# Patient Record
Sex: Female | Born: 1975 | Race: White | Hispanic: Yes | Smoking: Current some day smoker
Health system: Southern US, Community
[De-identification: ages and names within clinical notes are randomized; demographics above are authoritative.]

## PROBLEM LIST (undated history)

## (undated) DIAGNOSIS — J45909 Unspecified asthma, uncomplicated: Secondary | ICD-10-CM

## (undated) DIAGNOSIS — F319 Bipolar disorder, unspecified: Secondary | ICD-10-CM

## (undated) DIAGNOSIS — K219 Gastro-esophageal reflux disease without esophagitis: Secondary | ICD-10-CM

## (undated) DIAGNOSIS — F329 Major depressive disorder, single episode, unspecified: Secondary | ICD-10-CM

## (undated) DIAGNOSIS — M5126 Other intervertebral disc displacement, lumbar region: Secondary | ICD-10-CM

## (undated) DIAGNOSIS — F32A Depression, unspecified: Secondary | ICD-10-CM

## (undated) DIAGNOSIS — F419 Anxiety disorder, unspecified: Secondary | ICD-10-CM

## (undated) DIAGNOSIS — E119 Type 2 diabetes mellitus without complications: Secondary | ICD-10-CM

## (undated) DIAGNOSIS — F4481 Dissociative identity disorder: Secondary | ICD-10-CM

## (undated) HISTORY — PX: TUBAL LIGATION: SHX77

---

## 2007-06-05 ENCOUNTER — Emergency Department (HOSPITAL_COMMUNITY): Admission: EM | Admit: 2007-06-05 | Discharge: 2007-06-05 | Payer: Self-pay | Admitting: Family Medicine

## 2012-10-20 ENCOUNTER — Emergency Department (HOSPITAL_COMMUNITY): Payer: Medicaid Other

## 2012-10-20 ENCOUNTER — Emergency Department (HOSPITAL_COMMUNITY)
Admission: EM | Admit: 2012-10-20 | Discharge: 2012-10-20 | Disposition: A | Discharge status: Home / Self Care | Payer: Medicaid Other | Attending: Emergency Medicine | Admitting: Emergency Medicine

## 2012-10-20 DIAGNOSIS — Y9289 Other specified places as the place of occurrence of the external cause: Secondary | ICD-10-CM | POA: Insufficient documentation

## 2012-10-20 DIAGNOSIS — Y9301 Activity, walking, marching and hiking: Secondary | ICD-10-CM | POA: Insufficient documentation

## 2012-10-20 DIAGNOSIS — S93409A Sprain of unspecified ligament of unspecified ankle, initial encounter: Secondary | ICD-10-CM | POA: Insufficient documentation

## 2012-10-20 DIAGNOSIS — S93402A Sprain of unspecified ligament of left ankle, initial encounter: Secondary | ICD-10-CM

## 2012-10-20 DIAGNOSIS — W172XXA Fall into hole, initial encounter: Secondary | ICD-10-CM | POA: Insufficient documentation

## 2012-10-20 MED ORDER — IBUPROFEN 800 MG PO TABS: 800.0000 mg | ORAL_TABLET | Freq: Three times a day (TID) | ORAL | Status: DC

## 2012-10-20 NOTE — ED Notes (Signed)
Ortho tech called 

## 2012-10-20 NOTE — Progress Notes (Signed)
Orthopedic Tech Progress Note Patient Details:  Erika Henson 1975/08/10 [id number]  Ortho Devices Type of Ortho Device: CAM walker;Crutches Ortho Device/Splint Interventions: Application   Shawnie Pons 10/20/2012, 10:42 AM

## 2012-10-20 NOTE — ED Notes (Signed)
Ortho tech in. 

## 2012-10-20 NOTE — ED Notes (Signed)
Stepped in utility hole  Hurt left ankle no deformity minor abrasions to knee hurts to bear wt on ankle  ankle
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
## 2012-10-21 NOTE — ED Provider Notes (Signed)
Medical screening examination/treatment/procedure(s) were performed by non-physician practitioner and as supervising physician I was immediately available for consultation/collaboration.    Vida Roller, MD 10/21/12 2056

## 2013-01-08 ENCOUNTER — Encounter (HOSPITAL_COMMUNITY): Payer: Self-pay | Admitting: Emergency Medicine

## 2013-01-08 ENCOUNTER — Emergency Department (HOSPITAL_COMMUNITY)
Admission: EM | Admit: 2013-01-08 | Discharge: 2013-01-08 | Disposition: A | Discharge status: Home / Self Care | Payer: Worker's Compensation | Attending: Emergency Medicine | Admitting: Emergency Medicine

## 2013-01-08 DIAGNOSIS — X503XXA Overexertion from repetitive movements, initial encounter: Secondary | ICD-10-CM | POA: Insufficient documentation

## 2013-01-08 DIAGNOSIS — E119 Type 2 diabetes mellitus without complications: Secondary | ICD-10-CM | POA: Insufficient documentation

## 2013-01-08 DIAGNOSIS — S46909A Unspecified injury of unspecified muscle, fascia and tendon at shoulder and upper arm level, unspecified arm, initial encounter: Secondary | ICD-10-CM | POA: Insufficient documentation

## 2013-01-08 DIAGNOSIS — S4980XA Other specified injuries of shoulder and upper arm, unspecified arm, initial encounter: Secondary | ICD-10-CM | POA: Insufficient documentation

## 2013-01-08 DIAGNOSIS — Y990 Civilian activity done for income or pay: Secondary | ICD-10-CM | POA: Insufficient documentation

## 2013-01-08 DIAGNOSIS — M25512 Pain in left shoulder: Secondary | ICD-10-CM

## 2013-01-08 DIAGNOSIS — F17200 Nicotine dependence, unspecified, uncomplicated: Secondary | ICD-10-CM | POA: Insufficient documentation

## 2013-01-08 DIAGNOSIS — Y9289 Other specified places as the place of occurrence of the external cause: Secondary | ICD-10-CM | POA: Insufficient documentation

## 2013-01-08 DIAGNOSIS — Z79899 Other long term (current) drug therapy: Secondary | ICD-10-CM | POA: Insufficient documentation

## 2013-01-08 DIAGNOSIS — Y9389 Activity, other specified: Secondary | ICD-10-CM | POA: Insufficient documentation

## 2013-01-08 DIAGNOSIS — J45909 Unspecified asthma, uncomplicated: Secondary | ICD-10-CM | POA: Insufficient documentation

## 2013-01-08 HISTORY — DX: Unspecified asthma, uncomplicated: J45.909

## 2013-01-08 HISTORY — DX: Type 2 diabetes mellitus without complications: E11.9

## 2013-01-08 MED ORDER — CYCLOBENZAPRINE HCL 10 MG PO TABS: 10.0000 mg | ORAL_TABLET | Freq: Three times a day (TID) | ORAL | Status: DC | PRN

## 2013-01-08 MED ORDER — HYDROCODONE-ACETAMINOPHEN 5-325 MG PO TABS
1.0000 | ORAL_TABLET | Freq: Once | ORAL | Status: AC
  Administered 2013-01-08: 1 via ORAL
  Filled 2013-01-08: qty 1

## 2013-01-08 MED ORDER — HYDROCODONE-ACETAMINOPHEN 5-325 MG PO TABS: 1.0000 | ORAL_TABLET | ORAL | Status: DC | PRN

## 2013-01-08 NOTE — ED Notes (Addendum)
Was at work, was lifting a washing machine, feels like she pulled a muscle in L upper arm, pinpoints to deltoid bicep tricep area. Pain does not extend past shoulder or elbow. Occurred while lifting around 1615. Denies sx other than pain. Worse with movement. Better after taking an aleve PTA. H/o DM and asthma, not taking any meds for these.

## 2013-01-08 NOTE — ED Provider Notes (Signed)
CSN: [id number]     Arrival date & time 01/08/13  2044 History  This chart was scribed for non-physician practitioner Trixie Dredge, PA-C working with Gerhard Munch, MD by Valera Castle, ED scribe. This patient was seen in room TR05C/TR05C and the patient's care was started at 9:41 PM.   Chief Complaint  Patient presents with  . Arm Pain   The history is provided by the patient. No language interpreter was used.   HPI Comments: Erika Henson is a 37 y.o. female who presents to the Emergency Department complaining of sudden, sharp, constant, left arm pain, a severity of 6/10 PTA, onset 5 hours ago stating she pulled a muscle while lifting a washing machine with a co-worker while at work. She states the pain severity currently is an 8/10. 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 She states that when she arrived there she was told to clock off work. She reports taking Aleve for the pain, with some relief. She denies any known allergies. She denies left hand pain, numbness, tingling, back pain, abdominal pain, and any other associated symptoms. Pt has h/o DM and asthma.    PCP - No PCP Per Patient  Past Medical History  Diagnosis Date  . Diabetes mellitus without complication   . Asthma    Past Surgical History  Procedure Laterality Date  . Tubal ligation     No family history on file. History  Substance Use Topics  . Smoking status: Current Some Day Smoker  . Smokeless tobacco: Not on file  . Alcohol Use: Yes   OB History   Grav Para Term Preterm Abortions TAB SAB Ect Mult Living                 Review of Systems  Gastrointestinal: Negative for abdominal pain.  Musculoskeletal: Positive for myalgias (left arm). Negative for arthralgias and back pain.  Neurological: Negative for numbness (and tingling).   Allergies  Review of patient's allergies indicates no known allergies.  Home Medications   Current  Outpatient Rx  Name  Route  Sig  Dispense  Refill  . gabapentin (NEURONTIN) 100 MG capsule   Oral   Take 200 mg by mouth at bedtime.          LMP 11/08/2012  Physical Exam  Nursing note and vitals reviewed. Constitutional: She appears well-developed and well-nourished. No distress.  HENT:  Head: Normocephalic and atraumatic.  Neck: Neck supple.  Cardiovascular: Normal rate, regular rhythm and intact distal pulses.   Pulmonary/Chest: Effort normal and breath sounds normal. No respiratory distress. She has no wheezes. She has no rales.  Abdominal: Soft. She exhibits no distension. There is no tenderness. There is no rebound and no guarding.  Musculoskeletal:       Left shoulder: She exhibits decreased range of motion, tenderness and pain. She exhibits no bony tenderness, no swelling, no crepitus, no deformity and normal pulse.  Tenderness over left deltoid anterior shoulder. No bulging. Full active ROM of left elbow, left wrist, left hand.   Neurological: She is alert. She has normal strength. No cranial nerve deficit or sensory deficit.  Skin: Skin is warm. She is not diaphoretic.  No skin changes.   Psychiatric: She has a normal mood and affect. Her behavior is normal.    ED Course  Procedures (including critical care time)  COORDINATION OF CARE: 9:48 PM-Discussed treatment plan which includes pain medication and referral  to an orthopedist with pt at bedside and pt agreed to plan.   Labs Review Labs Reviewed - No data to display Imaging Review No results found.  EKG Interpretation   None       MDM   1. Left shoulder pain    Pt with soft tissue injury to the left shoulder while lifting a washing machine at work.  Neurovascularly intact.  No bony tenderness.  Decreased ROM of left shoulder secondary to pain, tenderness laterally and anteriorly.  Placed in sling, d/c home with norco, flexeril ,and orthopedic follow up.  Discussed findings, treatment, and follow up  with  patient.  Pt given return precautions.  Pt verbalizes understanding and agrees with plan.       I personally performed the services described in this documentation, which was scribed in my presence. The recorded information has been reviewed and is accurate.    Trixie Dredge, PA-C 01/08/13 2326

## 2013-01-09 NOTE — ED Provider Notes (Signed)
  Medical screening examination/treatment/procedure(s) were performed by non-physician practitioner and as supervising physician I was immediately available for consultation/collaboration.  EKG Interpretation   None          Gerhard Munch, MD 01/09/13 0028

## 2013-02-16 ENCOUNTER — Encounter: Payer: Self-pay | Admitting: Advanced Practice Midwife

## 2013-02-25 ENCOUNTER — Encounter: Payer: Self-pay | Admitting: Advanced Practice Midwife

## 2013-03-01 ENCOUNTER — Ambulatory Visit: Payer: Self-pay | Admitting: Advanced Practice Midwife

## 2013-03-15 ENCOUNTER — Ambulatory Visit: Payer: Self-pay | Admitting: Advanced Practice Midwife

## 2013-03-18 ENCOUNTER — Encounter: Payer: Self-pay | Admitting: Advanced Practice Midwife

## 2013-03-18 ENCOUNTER — Ambulatory Visit (INDEPENDENT_AMBULATORY_CARE_PROVIDER_SITE_OTHER): Payer: Medicaid Other | Admitting: Advanced Practice Midwife

## 2013-03-18 VITALS — BP 150/87 | HR 99 | Temp 98.2°F | Wt 239.0 lb

## 2013-03-18 DIAGNOSIS — Z0000 Encounter for general adult medical examination without abnormal findings: Secondary | ICD-10-CM

## 2013-03-18 LAB — POCT URINALYSIS DIPSTICK
BILIRUBIN UA: NEGATIVE
Blood, UA: NEGATIVE
Glucose, UA: 1000
KETONES UA: NEGATIVE
LEUKOCYTES UA: NEGATIVE
Nitrite, UA: NEGATIVE
PH UA: 5
PROTEIN UA: NEGATIVE
SPEC GRAV UA: 1.015
Urobilinogen, UA: NEGATIVE

## 2013-03-18 NOTE — Progress Notes (Signed)
Subjective:     Erika Henson is a 38 y.o. female here for a routine exam.  Current complaints: pt states that her menstral cycles that are painful and heavy.  Pt states that her cycles last 5-7 days and are irregular.  Pt states she  needs f/u on paps every 6 months.  Pt is now sexually active and needs annual exam.   Personal health questionnaire reviewed: yes.  Patient reports her last abnormal pap was in the late 90s. She reports needing cryosurgery. States since her tubal ligation after her son she has had painful periods and would like a hysterectomy. States her uterus is no good to her anymore she sees no need for it and would like it removed. She declines and is not interested in medication management for dysmenorrhea.   She sees a PCP who manages everything else for her. Declines need for labs other than STI screen of GC/CT.   Gynecologic History Patient's last menstrual period was 03/06/2013. Contraception: tubal ligation Last Pap: 2014. Results were: normal Last mammogram: n/a  Obstetric History OB History  Gravida Para Term Preterm AB SAB TAB Ectopic Multiple Living  3 3 2 1      2     # Outcome Date GA Lbr Len/2nd Weight Sex Delivery Anes PTL Lv  3 TRM 2002    M SVD   Y  2 PRE 2000 [id number]   M    ND  1 TRM 01/03/96    F SVD   Y       The following portions of the patient's history were reviewed and updated as appropriate: allergies, current medications, past family history, past medical history, past social history, past surgical history and problem list.  Review of Systems A comprehensive review of systems was negative.    Objective:    BP 150/87  Pulse 99  Temp(Src) 98.2 F (36.8 C)  Wt 239 lb (108.41 kg)  LMP 03/06/2013  General Appearance:    Alert, cooperative, no distress, appears stated age  Head:    Normocephalic, without obvious abnormality, atraumatic  Eyes:    PERRL, conjunctiva/corneas clear, EOM's intact, fundi    benign, both eyes  Ears:    Normal  TM's and external ear canals, both ears  Nose:   Nares normal, septum midline, mucosa normal, no drainage    or sinus tenderness  Throat:   Lips, mucosa, and tongue normal; teeth and gums normal  Neck:   Supple, symmetrical, trachea midline, no adenopathy;    thyroid:  no enlargement/tenderness/nodules; no carotid   bruit or JVD  Back:     Symmetric, no curvature, ROM normal, no CVA tenderness  Lungs:     Clear to auscultation bilaterally, respirations unlabored  Chest Wall:    No tenderness or deformity   Heart:    Regular rate and rhythm, S1 and S2 normal, no murmur, rub   or gallop  Breast Exam:    No tenderness, masses, or nipple abnormality  Abdomen:     Soft, non-tender, bowel sounds active all four quadrants,    no masses, no organomegaly  Genitalia:    Normal female without lesion, discharge or tenderness  Rectal:    Normal tone, normal prostate, no masses or tenderness;   guaiac negative stool  Extremities:   Extremities normal, atraumatic, no cyanosis or edema  Pulses:   2+ and symmetric all extremities  Skin:   Skin color, texture, turgor normal, multiple nevi on back various shapes and  sizes, not raised   Lymph nodes:   Cervical, supraclavicular, and axillary nodes normal  Neurologic:   CNII-XII intact, normal strength, sensation and reflexes    throughout      Assessment:    Healthy female exam.  Elevated BMI Dysmenorrhea BCM: Tubal Ligation Nevi on back   Plan:    Follow up in: month , with MD to discuss option of surgical intervention for dysmenorrhea..   Pap, GC/CT today Patient under care of PCP. Encouraged Dermatology for evaluation of Nevi on skin, patient declined referral.   30 min spent with patient greater than 80% spent in counseling and coordination of care.   Amy Roni Bread CNM

## 2013-03-19 LAB — GC/CHLAMYDIA PROBE AMP
CT PROBE, AMP APTIMA: POSITIVE — AB
GC Probe RNA: NEGATIVE

## 2013-03-21 LAB — PAP IG, CT-NG, RFX HPV ASCU
CHLAMYDIA PROBE AMP: POSITIVE — AB
GC Probe Amp: NEGATIVE

## 2013-03-22 ENCOUNTER — Other Ambulatory Visit: Payer: Self-pay | Admitting: Advanced Practice Midwife

## 2013-03-22 DIAGNOSIS — A5901 Trichomonal vulvovaginitis: Secondary | ICD-10-CM

## 2013-03-22 DIAGNOSIS — A749 Chlamydial infection, unspecified: Secondary | ICD-10-CM

## 2013-03-22 MED ORDER — AZITHROMYCIN 500 MG PO TABS: 1000.0000 mg | ORAL_TABLET | Freq: Once | ORAL | Status: DC

## 2013-03-22 MED ORDER — METRONIDAZOLE 500 MG PO TABS: 2000.0000 mg | ORAL_TABLET | Freq: Once | ORAL | Status: DC

## 2013-03-22 NOTE — Progress Notes (Signed)
Duplicate Note  HPI  Erika Henson is a 38 y.o. female here for a routine exam.  Current complaints: pt states that her menstral cycles that are painful and heavy.  Pt states that her cycles last 5-7 days and are irregular.  Pt states she  needs f/u on paps every 6 months.  Pt is now sexually active and needs annual exam.   Personal health questionnaire reviewed: yes.  Patient reports her last abnormal pap was in the late 90s. She reports needing cryosurgery. States since her tubal ligation after her son she has had painful periods and would like a hysterectomy. States her uterus is no good to her anymore she sees no need for it and would like it removed. She declines and is not interested in medication management for dysmenorrhea.   She sees a PCP who manages everything else for her. Declines need for labs other than STI screen of GC/CT.   Gynecologic History Patient's last menstrual period was 03/06/2013. Contraception: tubal ligation Last Pap: 2014. Results were: normal Last mammogram: n/a  Obstetric History OB History  Gravida Para Term Preterm AB SAB TAB Ectopic Multiple Living  3 3 2 1      2     # Outcome Date GA Lbr Len/2nd Weight Sex Delivery Anes PTL Lv  3 TRM 2002    M SVD   Y  2 PRE 2000 [redacted]w[redacted]d   M    ND  1 TRM 01/03/96    F SVD   Y       The following portions of the patient's history were reviewed and updated as appropriate: allergies, current medications, past family history, past medical history, past social history, past surgical history and problem list.  Review of Systems A comprehensive review of systems was negative.    Objective:    BP 150/87  Pulse 99  Temp(Src) 98.2 F (36.8 C)  Wt 239 lb (108.41 kg)  LMP 03/06/2013  General Appearance:    Alert, cooperative, no distress, appears stated age  Head:    Normocephalic, without obvious abnormality, atraumatic  Eyes:    PERRL, conjunctiva/corneas clear, EOM's intact, fundi    benign, both eyes  Ears:     Normal TM's and external ear canals, both ears  Nose:   Nares normal, septum midline, mucosa normal, no drainage    or sinus tenderness  Throat:   Lips, mucosa, and tongue normal; teeth and gums normal  Neck:   Supple, symmetrical, trachea midline, no adenopathy;    thyroid:  no enlargement/tenderness/nodules; no carotid   bruit or JVD  Back:     Symmetric, no curvature, ROM normal, no CVA tenderness  Lungs:     Clear to auscultation bilaterally, respirations unlabored  Chest Wall:    No tenderness or deformity   Heart:    Regular rate and rhythm, S1 and S2 normal, no murmur, rub   or gallop  Breast Exam:    No tenderness, masses, or nipple abnormality  Abdomen:     Soft, non-tender, bowel sounds active all four quadrants,    no masses, no organomegaly  Genitalia:    Normal female without lesion, discharge or tenderness  Rectal:    Normal tone, normal prostate, no masses or tenderness;   guaiac negative stool  Extremities:   Extremities normal, atraumatic, no cyanosis or edema  Pulses:   2+ and symmetric all extremities  Skin:   Skin color, texture, turgor normal, multiple nevi on back various shapes and  sizes, not raised   Lymph nodes:   Cervical, supraclavicular, and axillary nodes normal  Neurologic:   CNII-XII intact, normal strength, sensation and reflexes    throughout      Assessment:    Healthy female exam.  Elevated BMI Dysmenorrhea BCM: Tubal Ligation Nevi on back   Plan:    Follow up in: month , with MD to discuss option of surgical intervention for dysmenorrhea..   Pap, GC/CT today Patient under care of PCP. Encouraged Dermatology for evaluation of Nevi on skin, patient declined referral.   30 min spent with patient greater than 80% spent in counseling and coordination of care.   Mali Eppard Roni Bread CNM

## 2013-03-24 ENCOUNTER — Ambulatory Visit (INDEPENDENT_AMBULATORY_CARE_PROVIDER_SITE_OTHER): Payer: Medicaid Other | Admitting: Obstetrics & Gynecology

## 2013-03-24 ENCOUNTER — Other Ambulatory Visit: Payer: Self-pay | Admitting: *Deleted

## 2013-03-24 VITALS — BP 138/84 | HR 105 | Temp 98.2°F | Wt 244.0 lb

## 2013-03-24 DIAGNOSIS — A5901 Trichomonal vulvovaginitis: Secondary | ICD-10-CM

## 2013-03-24 DIAGNOSIS — N945 Secondary dysmenorrhea: Secondary | ICD-10-CM

## 2013-03-24 DIAGNOSIS — A749 Chlamydial infection, unspecified: Secondary | ICD-10-CM

## 2013-03-24 NOTE — Progress Notes (Signed)
Subjective:     Erika Henson is a 38 y.o. female here for a routine exam.  Current complaints: pt is in office today to discuss surgical options for treatment of dysmenorrhea.  Pt was in office to see Amy on 2/13.  Pt was advised to f/u with MD.  Pt also advised of lab result  and treatment recommendations for Chlamydia and Trichomonas.  Personal health questionnaire reviewed: yes.   Gynecologic History Patient's last menstrual period was 03/06/2013. Contraception: tubal ligation Last Pap: 2015. Results were: normal Last mammogram: n/a  Obstetric History OB History  Gravida Para Term Preterm AB SAB TAB Ectopic Multiple Living  3 3 2 1      2     # Outcome Date GA Lbr Len/2nd Weight Sex Delivery Anes PTL Lv  3 TRM 2002    M SVD   Y  2 PRE 2000 [redacted]w[redacted]d   M    ND  1 TRM 01/03/96    F SVD   Y       The following portions of the patient's history were reviewed and updated as appropriate: allergies, current medications, past family history, past medical history, past social history, past surgical history and problem list.  Review of Systems Pertinent items are noted in HPI.    Objective:     No exam today     Assessment:   Primary dysmenorrhea ?H/O pelvic infection TOA--s/p BTL--?IR percutaneous drainage on 2 occasions  Plan:   Review previous records Return 1 mth

## 2013-03-25 ENCOUNTER — Encounter: Payer: Self-pay | Admitting: Obstetrics & Gynecology

## 2013-03-25 DIAGNOSIS — A749 Chlamydial infection, unspecified: Secondary | ICD-10-CM | POA: Insufficient documentation

## 2013-03-25 DIAGNOSIS — A5901 Trichomonal vulvovaginitis: Secondary | ICD-10-CM | POA: Insufficient documentation

## 2013-03-25 NOTE — Patient Instructions (Signed)

## 2013-03-31 ENCOUNTER — Ambulatory Visit (HOSPITAL_COMMUNITY)
Admission: RE | Admit: 2013-03-31 | Discharge: 2013-03-31 | Disposition: A | Discharge status: Home / Self Care | Payer: Medicaid Other | Source: Ambulatory Visit | Attending: Obstetrics & Gynecology | Admitting: Obstetrics & Gynecology

## 2013-03-31 DIAGNOSIS — D259 Leiomyoma of uterus, unspecified: Secondary | ICD-10-CM | POA: Insufficient documentation

## 2013-03-31 DIAGNOSIS — Z9851 Tubal ligation status: Secondary | ICD-10-CM | POA: Diagnosis not present

## 2013-03-31 DIAGNOSIS — N946 Dysmenorrhea, unspecified: Secondary | ICD-10-CM | POA: Insufficient documentation

## 2013-03-31 DIAGNOSIS — Z87891 Personal history of nicotine dependence: Secondary | ICD-10-CM | POA: Insufficient documentation

## 2013-03-31 DIAGNOSIS — N72 Inflammatory disease of cervix uteri: Secondary | ICD-10-CM | POA: Diagnosis not present

## 2013-03-31 DIAGNOSIS — N945 Secondary dysmenorrhea: Secondary | ICD-10-CM

## 2013-03-31 DIAGNOSIS — N926 Irregular menstruation, unspecified: Secondary | ICD-10-CM | POA: Diagnosis not present

## 2013-04-05 ENCOUNTER — Encounter: Payer: Self-pay | Admitting: Obstetrics & Gynecology

## 2013-04-07 ENCOUNTER — Ambulatory Visit: Payer: Medicaid Other | Admitting: Obstetrics & Gynecology

## 2013-04-11 ENCOUNTER — Encounter: Payer: Self-pay | Admitting: Obstetrics & Gynecology

## 2013-04-13 ENCOUNTER — Ambulatory Visit (INDEPENDENT_AMBULATORY_CARE_PROVIDER_SITE_OTHER): Payer: Medicaid Other | Admitting: Obstetrics & Gynecology

## 2013-04-13 ENCOUNTER — Encounter: Payer: Self-pay | Admitting: Obstetrics & Gynecology

## 2013-04-13 VITALS — BP 124/87 | HR 92 | Wt 242.0 lb

## 2013-04-13 DIAGNOSIS — E1165 Type 2 diabetes mellitus with hyperglycemia: Secondary | ICD-10-CM

## 2013-04-13 DIAGNOSIS — N946 Dysmenorrhea, unspecified: Secondary | ICD-10-CM

## 2013-04-13 DIAGNOSIS — E119 Type 2 diabetes mellitus without complications: Secondary | ICD-10-CM

## 2013-04-13 LAB — HEMOGLOBIN A1C
Hgb A1c MFr Bld: 10.1 % — ABNORMAL HIGH (ref <5.7)
Mean Plasma Glucose: 243 mg/dL — ABNORMAL HIGH (ref <117)

## 2013-04-13 NOTE — Progress Notes (Signed)
Subjective:     Erika Henson is a 38 y.o. female here for a follow up dysmennorhea exam.  Current complaints:  Pt states her cycles have not improved.  Personal health questionnaire reviewed: yes.   Gynecologic History Patient's last menstrual period was 04/03/2013. Contraception: tubal ligation   Obstetric History OB History  Gravida Para Term Preterm AB SAB TAB Ectopic Multiple Living  3 3 2 1      2     # Outcome Date GA Lbr Len/2nd Weight Sex Delivery Anes PTL Lv  3 TRM 2002    M SVD   Y  2 PRE 2000 [redacted]w[redacted]d   M    ND  1 TRM 01/03/96    F SVD   Y       The following portions of the patient's history were reviewed and updated as appropriate: allergies, current medications, past family history, past medical history, past social history, past surgical history and problem list.  Review of Systems Pertinent items are noted in HPI.    Objective:     No exam today     Assessment:   Dysmenorrhea/small fibroid uterus Declines medical management  Plan:      Orders Placed This Encounter  Procedures  . Hemoglobin A1c  Return for repeat GC/CT probes/preop visit

## 2013-05-16 ENCOUNTER — Ambulatory Visit: Payer: Medicaid Other | Admitting: Obstetrics & Gynecology

## 2013-05-16 DIAGNOSIS — Z202 Contact with and (suspected) exposure to infections with a predominantly sexual mode of transmission: Secondary | ICD-10-CM

## 2013-05-16 DIAGNOSIS — A5400 Gonococcal infection of lower genitourinary tract, unspecified: Secondary | ICD-10-CM

## 2013-05-16 DIAGNOSIS — B373 Candidiasis of vulva and vagina: Secondary | ICD-10-CM

## 2013-05-16 DIAGNOSIS — B3731 Acute candidiasis of vulva and vagina: Secondary | ICD-10-CM

## 2013-05-16 MED ORDER — VALACYCLOVIR HCL 1 G PO TABS: 1000.0000 mg | ORAL_TABLET | Freq: Two times a day (BID) | ORAL | Status: DC

## 2013-05-16 MED ORDER — FLUCONAZOLE 150 MG PO TABS: 150.0000 mg | ORAL_TABLET | ORAL | Status: DC

## 2013-05-16 NOTE — Progress Notes (Unsigned)
Subjective:     Erika Henson is a 38 y.o. female here for a routine exam.  Current complaints: pt states that she is having lots of lower pelvic pain.  Pt states that she has also had a tear in vaginal/rectal area.  Pt states that there is a lot of pain and irritation.  Pt states that she is in need of TOC from previous positive GC/CH.   Pt states that she has also seen [facility] for her Diabetes and Cholesterol management.  Pt has been started on Metformin to help control. Pt states that she would like to see a Nutritionist.     The HPI was reviewed and explored in further detail by the provider. Gynecologic History No LMP recorded. Patient is not currently having periods (Reason: Irregular Periods). Contraception: tubal ligation Last Pap: 03/2013. Results were: normal   Obstetric History OB History  Gravida Para Term Preterm AB SAB TAB Ectopic Multiple Living  3 3 2 1      2     # Outcome Date GA Lbr Len/2nd Weight Sex Delivery Anes PTL Lv  3 TRM 2002    M SVD   Y  2 PRE 2000 [redacted]w[redacted]d   M    ND  1 TRM 01/03/96    F SVD   Y       {Common ambulatory SmartLinks:19316}  Review of Systems {ros; complete:30496}    Objective:    {exam; complete:18323}    ***% of *** min visit spent on counseling and coordination of care.   Assessment:    Healthy female exam.    Plan:    {plan:19193}

## 2013-05-20 ENCOUNTER — Encounter: Payer: Self-pay | Admitting: Obstetrics & Gynecology

## 2013-05-20 DIAGNOSIS — A549 Gonococcal infection, unspecified: Secondary | ICD-10-CM | POA: Insufficient documentation

## 2013-05-20 DIAGNOSIS — A6000 Herpesviral infection of urogenital system, unspecified: Secondary | ICD-10-CM | POA: Insufficient documentation

## 2013-05-23 ENCOUNTER — Other Ambulatory Visit: Payer: Self-pay | Admitting: *Deleted

## 2013-05-23 DIAGNOSIS — A549 Gonococcal infection, unspecified: Secondary | ICD-10-CM

## 2013-05-23 MED ORDER — AZITHROMYCIN 250 MG PO TABS
1000.0000 mg | ORAL_TABLET | Freq: Once | ORAL | Status: DC
Start: 2013-05-23 — End: 2013-06-30

## 2013-05-24 ENCOUNTER — Ambulatory Visit (INDEPENDENT_AMBULATORY_CARE_PROVIDER_SITE_OTHER): Payer: Medicaid Other | Admitting: *Deleted

## 2013-05-24 ENCOUNTER — Encounter: Payer: Self-pay | Admitting: Obstetrics & Gynecology

## 2013-05-24 VITALS — BP 141/87 | HR 99 | Temp 97.5°F

## 2013-05-24 DIAGNOSIS — A549 Gonococcal infection, unspecified: Secondary | ICD-10-CM

## 2013-05-24 DIAGNOSIS — A5400 Gonococcal infection of lower genitourinary tract, unspecified: Secondary | ICD-10-CM

## 2013-05-24 MED ORDER — CEFTRIAXONE SODIUM 1 G IJ SOLR
250.0000 mg | Freq: Once | INTRAMUSCULAR | Status: AC
  Administered 2013-05-24: 250 mg via INTRAMUSCULAR

## 2013-05-24 NOTE — Progress Notes (Unsigned)
Pt is in office today for Rocephin injection for positive Gonorrhea results. Injection given in RUOQ. Pt tolerated well. Pt has been advised to abstain from intercourse for 2 week after both she and partner have been treated.  Pt advised to RTO in 2-3 months for TOC.

## 2013-06-02 ENCOUNTER — Ambulatory Visit: Payer: Medicaid Other | Admitting: Obstetrics & Gynecology

## 2013-06-30 ENCOUNTER — Encounter: Payer: Self-pay | Admitting: Obstetrics & Gynecology

## 2013-06-30 ENCOUNTER — Ambulatory Visit: Payer: Medicaid Other | Admitting: Obstetrics & Gynecology

## 2013-06-30 ENCOUNTER — Ambulatory Visit (INDEPENDENT_AMBULATORY_CARE_PROVIDER_SITE_OTHER): Payer: Medicaid Other | Admitting: Obstetrics & Gynecology

## 2013-06-30 VITALS — BP 121/79 | HR 84 | Temp 98.9°F

## 2013-06-30 DIAGNOSIS — Z202 Contact with and (suspected) exposure to infections with a predominantly sexual mode of transmission: Secondary | ICD-10-CM

## 2013-06-30 DIAGNOSIS — A6000 Herpesviral infection of urogenital system, unspecified: Secondary | ICD-10-CM

## 2013-06-30 MED ORDER — VALACYCLOVIR HCL 1 G PO TABS: 1000.0000 mg | ORAL_TABLET | Freq: Two times a day (BID) | ORAL | Status: AC

## 2013-06-30 NOTE — Progress Notes (Signed)
Pt not seen in office today by physician.  Nursing Note:  Pt is in office today for f/u from previous STD testing and medication given, Valtrex, from April 2015.  Pt states she completed the Valtrex as prescribed.  Pt states that while taking medication her symptoms resolved.  Pt states that today she is having another genital outbreak.  Pt states that she shaves her pubic area and thinks this may have triggered this outbreak.  Pt was advised to not shave if possible to avoid any unnecessary irritation.    Pt advised that her Valtrex could be refilled based on her current need.  Pt also states that she has had a change in cycle this month as well.  Pt states that she had an episode of bleeding at the beginning of the month, off cycle for approx a week and began to bleed again.  Pt states that this is the first occurrence of this happening with cycle.  Pt states concerns that she is having in regards to her diabetes and surgery that has been previously discussed.     Pt states that she is unable to stay to see Dr Delsa Sale today since she was under the impression her visit was a nurse follow up.  Pt request to reschedule at a time that she will be able to see physician. Pt made aware that all concerns could be discussed at f/u visit.   Pt advised to keep follow up appt that she is given.

## 2013-07-11 ENCOUNTER — Ambulatory Visit: Payer: Self-pay | Admitting: Obstetrics & Gynecology

## 2013-07-28 ENCOUNTER — Ambulatory Visit (INDEPENDENT_AMBULATORY_CARE_PROVIDER_SITE_OTHER): Payer: Medicaid Other | Admitting: Obstetrics & Gynecology

## 2013-07-28 VITALS — BP 113/78 | HR 101 | Wt 233.0 lb

## 2013-07-28 DIAGNOSIS — N764 Abscess of vulva: Secondary | ICD-10-CM

## 2013-07-29 ENCOUNTER — Encounter: Payer: Self-pay | Admitting: Obstetrics & Gynecology

## 2013-07-29 NOTE — Progress Notes (Signed)
Patient ID: Erika Henson, female   DOB: 1975-07-20, 38 y.o.   MRN: [id number]  Chief Complaint  Patient presents with  . Recurrent in-grown hair, vulva    HPI Erika Henson is a 38 y.o. female.  The patient has stopped shaving.  She is plucking the hairs.  HPI  Past Medical History  Diagnosis Date  . Diabetes mellitus without complication   . Asthma     Past Surgical History  Procedure Laterality Date  . Tubal ligation      No family history on file.  Social History History  Substance Use Topics  . Smoking status: Current Some Day Smoker  . Smokeless tobacco: Not on file  . Alcohol Use: Yes    No Known Allergies  Current Outpatient Prescriptions  Medication Sig Dispense Refill  . gabapentin (NEURONTIN) 100 MG capsule Take 200 mg by mouth at bedtime.      . metFORMIN (GLUCOPHAGE) 1000 MG tablet Take 1,000 mg by mouth 2 (two) times daily with a meal.      . traMADol (ULTRAM) 50 MG tablet Take by mouth every 6 (six) hours as needed.       No current facility-administered medications for this visit.    Review of Systems Review of Systems Constitutional: negative for fatigue and weight loss Respiratory: negative for cough and wheezing Cardiovascular: negative for chest pain, fatigue and palpitations Gastrointestinal: negative for abdominal pain and change in bowel habits Genitourinary: painful areas/bumps  Integument/breast: negative for nipple discharge Musculoskeletal:negative for myalgias Neurological: negative for gait problems and tremors Behavioral/Psych: negative for abusive relationship Endocrine: negative for temperature intolerance     Blood pressure 113/78, pulse 101, weight 105.688 kg (233 lb), last menstrual period 06/15/2013.  Physical Exam Physical Exam General:   alert  Skin:   no rash or abnormalities  Lungs:   clear to auscultation bilaterally  Heart:   regular rate and rhythm, S1, S2 normal, no murmur, click, rub or gallop  Breasts:    normal without suspicious masses, skin or nipple changes or axillary nodes  Abdomen:  normal findings: no organomegaly, soft, non-tender and no hernia  Pelvis:  External genitalia: multiple nodules/scarring       Data Reviewed None  Assessment    Boils, ?community acquired MRSA--vulva    Plan    Possible management options include: loose fitting clothes, avoid trauma, Hibiclens 3 times/week; may need testing for MRSA Follow up as needed.        JACKSON-MOORE,Ahmari Garton A 07/29/2013, 12:23 PM

## 2013-07-29 NOTE — Patient Instructions (Signed)

## 2013-10-21 ENCOUNTER — Emergency Department (HOSPITAL_COMMUNITY)
Admission: EM | Admit: 2013-10-21 | Discharge: 2013-10-21 | Disposition: A | Discharge status: Home / Self Care | Payer: Medicaid Other | Attending: Emergency Medicine | Admitting: Emergency Medicine

## 2013-10-21 ENCOUNTER — Emergency Department (HOSPITAL_COMMUNITY): Payer: Medicaid Other

## 2013-10-21 ENCOUNTER — Encounter (HOSPITAL_COMMUNITY): Payer: Self-pay | Admitting: Emergency Medicine

## 2013-10-21 DIAGNOSIS — J45909 Unspecified asthma, uncomplicated: Secondary | ICD-10-CM | POA: Insufficient documentation

## 2013-10-21 DIAGNOSIS — B9789 Other viral agents as the cause of diseases classified elsewhere: Secondary | ICD-10-CM

## 2013-10-21 DIAGNOSIS — R05 Cough: Secondary | ICD-10-CM | POA: Diagnosis present

## 2013-10-21 DIAGNOSIS — F17200 Nicotine dependence, unspecified, uncomplicated: Secondary | ICD-10-CM | POA: Diagnosis not present

## 2013-10-21 DIAGNOSIS — Z91040 Latex allergy status: Secondary | ICD-10-CM | POA: Insufficient documentation

## 2013-10-21 DIAGNOSIS — R059 Cough, unspecified: Secondary | ICD-10-CM | POA: Diagnosis present

## 2013-10-21 DIAGNOSIS — E119 Type 2 diabetes mellitus without complications: Secondary | ICD-10-CM | POA: Diagnosis not present

## 2013-10-21 DIAGNOSIS — Z79899 Other long term (current) drug therapy: Secondary | ICD-10-CM | POA: Diagnosis not present

## 2013-10-21 DIAGNOSIS — J069 Acute upper respiratory infection, unspecified: Secondary | ICD-10-CM | POA: Diagnosis not present

## 2013-10-21 MED ORDER — ONDANSETRON HCL 4 MG PO TABS: 4.0000 mg | ORAL_TABLET | Freq: Four times a day (QID) | ORAL | Status: DC

## 2013-10-21 MED ORDER — LIDOCAINE VISCOUS 2 % MT SOLN: 20.0000 mL | OROMUCOSAL | Status: DC | PRN

## 2013-10-21 MED ORDER — GUAIFENESIN 100 MG/5ML PO LIQD: 100.0000 mg | ORAL | Status: DC | PRN

## 2013-10-21 NOTE — ED Provider Notes (Signed)
History/physical exam/procedure(s) were performed by non-physician practitioner and as supervising physician I was immediately available for consultation/collaboration. I have reviewed all notes and am in agreement with care and plan.   Shaune Pollack, MD 10/21/13 [phone]

## 2013-10-21 NOTE — ED Provider Notes (Signed)
CSN: [id number]     Arrival date & time 10/21/13  3845 History  This chart was scribed for non-physician practitioner Starlyn Skeans working with Shaune Pollack, MD by Zola Button, ED Scribe. This patient was seen in room TR05C/TR05C and the patient's care was started at 9:58 AM.     Chief Complaint  Patient presents with  . URI      The history is provided by the patient. No language interpreter was used.   HPI Comments: Erika Henson is a 38 y.o. female with a Hx of asthma and mental health conditions who presents to the Emergency Department complaining of URI symptoms which includes sneezing, productive cough with yellow and green sputum, and sore throat that began yesterday. She notes associated possible subjective fever, runny nose, SOB, nausea, and facial pain. She has tried taking Nyquil for her symptoms but with no relief. Patient denies taking any medications for the symptoms. She also denies chills, ear pain, diarrhea, constipation, abdominal pain, and vomiting. She is a 0.5ppd smoker and has allergies to morphine.  Past Medical History  Diagnosis Date  . Diabetes mellitus without complication   . Asthma    Past Surgical History  Procedure Laterality Date  . Tubal ligation     History reviewed. No pertinent family history. History  Substance Use Topics  . Smoking status: Current Some Day Smoker  . Smokeless tobacco: Not on file  . Alcohol Use: Yes   OB History   Grav Para Term Preterm Abortions TAB SAB Ect Mult Living   3 3 2 1      2      Review of Systems  All other systems reviewed and are negative.     Allergies  Latex  Home Medications   Prior to Admission medications   Medication Sig Start Date End Date Taking? Authorizing Provider  albuterol (PROVENTIL HFA;VENTOLIN HFA) 108 (90 BASE) MCG/ACT inhaler Inhale 1 puff into the lungs every 6 (six) hours as needed for wheezing or shortness of breath.   Yes Historical Provider, MD  citalopram (CELEXA) 20  MG tablet Take 20 mg by mouth daily.   Yes Historical Provider, MD  gabapentin (NEURONTIN) 100 MG capsule Take 200 mg by mouth at bedtime.   Yes Historical Provider, MD  guaiFENesin (ROBITUSSIN) 100 MG/5ML liquid Take 5-10 mLs (100-200 mg total) by mouth every 4 (four) hours as needed for cough. 10/21/13   Reah Justo A Forcucci, PA-C  lidocaine (XYLOCAINE) 2 % solution Use as directed 20 mLs in the mouth or throat as needed for mouth pain. 10/21/13   Scherrie Seneca A Forcucci, PA-C  ondansetron (ZOFRAN) 4 MG tablet Take 1 tablet (4 mg total) by mouth every 6 (six) hours. 10/21/13   Hermie Reagor A Forcucci, PA-C   BP 121/59  Pulse 98  Temp(Src) 98.1 F (36.7 C) (Oral)  Resp 18  Ht 5\' 5"  (1.651 m)  Wt 215 lb (97.523 kg)  BMI 35.78 kg/m2  SpO2 100%  LMP 09/20/2013 Physical Exam  Nursing note and vitals reviewed. Constitutional: She is oriented to person, place, and time. She appears well-developed and well-nourished. No distress.  HENT:  Head: Normocephalic and atraumatic.  Right Ear: Tympanic membrane normal.  Left Ear: Tympanic membrane normal.  Mouth/Throat: Oropharynx is clear and moist. No oropharyngeal exudate.  Mucosal edema and congestion, ethmoid tenderness  Eyes: Conjunctivae are normal. Pupils are equal, round, and reactive to light. No scleral icterus.  Neck: Normal range of motion. Neck supple.  Cardiovascular: Normal rate,  regular rhythm and normal heart sounds.  Exam reveals no gallop and no friction rub.   No murmur heard. Pulmonary/Chest: Effort normal and breath sounds normal. No respiratory distress. She has no wheezes. She has no rales. She exhibits no tenderness.  Abdominal: Soft. Bowel sounds are normal. She exhibits no distension and no mass. There is no tenderness. There is no rebound and no guarding.  Musculoskeletal: She exhibits no edema.  Lymphadenopathy:    She has cervical adenopathy.  Neurological: She is alert and oriented to person, place, and time.  Skin: Skin is  warm and dry. No rash noted.  Psychiatric: She has a normal mood and affect. Her behavior is normal.    ED Course  Procedures  DIAGNOSTIC STUDIES: Oxygen Saturation is 100% on RA, nml by my interpretation.    COORDINATION OF CARE: 9:49 AM-Discussed treatment plan which includes nasal saline, CXR, cough medicine with pt at bedside. Recommended to patient to take Aleve maximum 2 times in the morning, 2 times at night, and drink lots of water and pt agreed to plan.   Labs Review Labs Reviewed - No data to display  Imaging Review Dg Chest 2 View  10/21/2013   CLINICAL DATA:  Upper respiratory infection  EXAM: CHEST  2 VIEW  COMPARISON:  None.  FINDINGS: Lungs are clear. Heart size and pulmonary vascularity are normal. No adenopathy. No bone lesions.  IMPRESSION: No edema or consolidation.   Electronically Signed   By: Lowella Grip M.D.   On: 10/21/2013 09:26     EKG Interpretation None      MDM   Final diagnoses:  Viral URI with cough   Patient is a 38 y.o. Female who presents to the ED with cough, congestion, and malaise.  Patient is afebrile and SpO2 is 100% on room air.  Physical examination reveals mucosal edema of the nose, lungs which are clear to auscultation, and no chest tenderness.  Patient had CXR performed here today given productive cough.  There is no consolidation or acute findings on chest xray.  Suspect that the patient likely has viral URI vs bronchitis.  Differential also includes acute sinusitis.  Given short period of illness suspect that this is viral.  Will treat symptomatically with nasal saline, fluids, aleve for myalgias, robitussin, zofran, and viscous lidocaine.  Patient to return for worsening shortness of breath, wheezing, and trismus.  Patient states understanding and agreement at this time.  Patient is stable for discharge.    I personally performed the services described in this documentation, which was scribed in my presence. The recorded information  has been reviewed and is accurate.    Cherylann Parr, PA-C 10/21/13 [phone]

## 2013-10-21 NOTE — ED Notes (Signed)
Pt complaining URI symptoms since yesterday. Denies taking any medication.

## 2013-10-21 NOTE — Discharge Instructions (Signed)
Upper Respiratory Infection, Adult An upper respiratory infection (URI) is also sometimes known as the common cold. The upper respiratory tract includes the nose, sinuses, throat, trachea, and bronchi. Bronchi are the airways leading to the lungs. Most people improve within 1 week, but symptoms can last up to 2 weeks. A residual cough may last even longer.  CAUSES Many different viruses can infect the tissues lining the upper respiratory tract. The tissues become irritated and inflamed and often become very moist. Mucus production is also common. A cold is contagious. You can easily spread the virus to others by oral contact. This includes kissing, sharing a glass, coughing, or sneezing. Touching your mouth or nose and then touching a surface, which is then touched by another person, can also spread the virus. SYMPTOMS  Symptoms typically develop 1 to 3 days after you come in contact with a cold virus. Symptoms vary from person to person. They may include:  Runny nose.  Sneezing.  Nasal congestion.  Sinus irritation.  Sore throat.  Loss of voice (laryngitis).  Cough.  Fatigue.  Muscle aches.  Loss of appetite.  Headache.  Low-grade fever. DIAGNOSIS  You might diagnose your own cold based on familiar symptoms, since most people get a cold 2 to 3 times a year. Your caregiver can confirm this based on your exam. Most importantly, your caregiver can check that your symptoms are not due to another disease such as strep throat, sinusitis, pneumonia, asthma, or epiglottitis. Blood tests, throat tests, and X-rays are not necessary to diagnose a common cold, but they may sometimes be helpful in excluding other more serious diseases. Your caregiver will decide if any further tests are required. RISKS AND COMPLICATIONS  You may be at risk for a more severe case of the common cold if you smoke cigarettes, have chronic heart disease (such as heart failure) or lung disease (such as asthma), or if  you have a weakened immune system. The very young and very old are also at risk for more serious infections. Bacterial sinusitis, middle ear infections, and bacterial pneumonia can complicate the common cold. The common cold can worsen asthma and chronic obstructive pulmonary disease (COPD). Sometimes, these complications can require emergency medical care and may be life-threatening. PREVENTION  The best way to protect against getting a cold is to practice good hygiene. Avoid oral or hand contact with people with cold symptoms. Wash your hands often if contact occurs. There is no clear evidence that vitamin C, vitamin E, echinacea, or exercise reduces the chance of developing a cold. However, it is always recommended to get plenty of rest and practice good nutrition. TREATMENT  Treatment is directed at relieving symptoms. There is no cure. Antibiotics are not effective, because the infection is caused by a virus, not by bacteria. Treatment may include:  Increased fluid intake. Sports drinks offer valuable electrolytes, sugars, and fluids.  Breathing heated mist or steam (vaporizer or shower).  Eating chicken soup or other clear broths, and maintaining good nutrition.  Getting plenty of rest.  Using gargles or lozenges for comfort.  Controlling fevers with ibuprofen or acetaminophen as directed by your caregiver.  Increasing usage of your inhaler if you have asthma. Zinc gel and zinc lozenges, taken in the first 24 hours of the common cold, can shorten the duration and lessen the severity of symptoms. Pain medicines may help with fever, muscle aches, and throat pain. A variety of non-prescription medicines are available to treat congestion and runny nose. Your caregiver   can make recommendations and may suggest nasal or lung inhalers for other symptoms.  HOME CARE INSTRUCTIONS   Only take over-the-counter or prescription medicines for pain, discomfort, or fever as directed by your  caregiver.  Use a warm mist humidifier or inhale steam from a shower to increase air moisture. This may keep secretions moist and make it easier to breathe.  Drink enough water and fluids to keep your urine clear or pale yellow.  Rest as needed.  Return to work when your temperature has returned to normal or as your caregiver advises. You may need to stay home longer to avoid infecting others. You can also use a face mask and careful hand washing to prevent spread of the virus. SEEK MEDICAL CARE IF:   After the first few days, you feel you are getting worse rather than better.  You need your caregiver's advice about medicines to control symptoms.  You develop chills, worsening shortness of breath, or brown or red sputum. These may be signs of pneumonia.  You develop yellow or brown nasal discharge or pain in the face, especially when you bend forward. These may be signs of sinusitis.  You develop a fever, swollen neck glands, pain with swallowing, or white areas in the back of your throat. These may be signs of strep throat. SEEK IMMEDIATE MEDICAL CARE IF:   You have a fever.  You develop severe or persistent headache, ear pain, sinus pain, or chest pain.  You develop wheezing, a prolonged cough, cough up blood, or have a change in your usual mucus (if you have chronic lung disease).  You develop sore muscles or a stiff neck. Document Released: 07/16/2000 Document Revised: 04/14/2011 Document Reviewed: 04/27/2013 [employer] Patient Information 2015 [employer]. This information is not intended to replace advice given to you by your health care provider. Make sure you discuss any questions you have with your health care provider.  

## 2013-11-27 ENCOUNTER — Emergency Department (HOSPITAL_COMMUNITY)
Admission: EM | Admit: 2013-11-27 | Discharge: 2013-11-27 | Disposition: A | Discharge status: Home / Self Care | Payer: Medicaid Other | Attending: Emergency Medicine | Admitting: Emergency Medicine

## 2013-11-27 ENCOUNTER — Encounter (HOSPITAL_COMMUNITY): Payer: Self-pay | Admitting: Emergency Medicine

## 2013-11-27 DIAGNOSIS — N3090 Cystitis, unspecified without hematuria: Secondary | ICD-10-CM | POA: Insufficient documentation

## 2013-11-27 DIAGNOSIS — J45909 Unspecified asthma, uncomplicated: Secondary | ICD-10-CM | POA: Insufficient documentation

## 2013-11-27 DIAGNOSIS — Z9851 Tubal ligation status: Secondary | ICD-10-CM | POA: Insufficient documentation

## 2013-11-27 DIAGNOSIS — Z79899 Other long term (current) drug therapy: Secondary | ICD-10-CM | POA: Insufficient documentation

## 2013-11-27 DIAGNOSIS — E119 Type 2 diabetes mellitus without complications: Secondary | ICD-10-CM | POA: Diagnosis not present

## 2013-11-27 DIAGNOSIS — Z3202 Encounter for pregnancy test, result negative: Secondary | ICD-10-CM | POA: Insufficient documentation

## 2013-11-27 DIAGNOSIS — Z91040 Latex allergy status: Secondary | ICD-10-CM | POA: Insufficient documentation

## 2013-11-27 DIAGNOSIS — R350 Frequency of micturition: Secondary | ICD-10-CM | POA: Diagnosis present

## 2013-11-27 DIAGNOSIS — Z720 Tobacco use: Secondary | ICD-10-CM | POA: Diagnosis not present

## 2013-11-27 LAB — URINALYSIS, ROUTINE W REFLEX MICROSCOPIC
Glucose, UA: 500 mg/dL — AB
KETONES UR: 15 mg/dL — AB
Nitrite: NEGATIVE
Specific Gravity, Urine: 1.028 (ref 1.005–1.030)
Urobilinogen, UA: 0.2 mg/dL (ref 0.0–1.0)
pH: 5.5 (ref 5.0–8.0)

## 2013-11-27 LAB — CBC
HCT: 40.9 % (ref 36.0–46.0)
HEMOGLOBIN: 13.6 g/dL (ref 12.0–15.0)
MCH: 29.9 pg (ref 26.0–34.0)
MCHC: 33.3 g/dL (ref 30.0–36.0)
MCV: 89.9 fL (ref 78.0–100.0)
Platelets: 207 10*3/uL (ref 150–400)
RBC: 4.55 MIL/uL (ref 3.87–5.11)
RDW: 12.9 % (ref 11.5–15.5)
WBC: 9.8 10*3/uL (ref 4.0–10.5)

## 2013-11-27 LAB — WET PREP, GENITAL
CLUE CELLS WET PREP: NONE SEEN
Trich, Wet Prep: NONE SEEN
WBC, Wet Prep HPF POC: NONE SEEN
Yeast Wet Prep HPF POC: NONE SEEN

## 2013-11-27 LAB — I-STAT CHEM 8, ED
BUN: 7 mg/dL (ref 6–23)
Calcium, Ion: 1.12 mmol/L (ref 1.12–1.23)
Chloride: 104 mEq/L (ref 96–112)
Creatinine, Ser: 0.5 mg/dL (ref 0.50–1.10)
GLUCOSE: 203 mg/dL — AB (ref 70–99)
HCT: 44 % (ref 36.0–46.0)
Hemoglobin: 15 g/dL (ref 12.0–15.0)
Potassium: 4.2 mEq/L (ref 3.7–5.3)
Sodium: 139 mEq/L (ref 137–147)
TCO2: 23 mmol/L (ref 0–100)

## 2013-11-27 LAB — PREGNANCY, URINE: PREG TEST UR: NEGATIVE

## 2013-11-27 LAB — URINE MICROSCOPIC-ADD ON

## 2013-11-27 MED ORDER — PHENAZOPYRIDINE HCL 200 MG PO TABS: 200.0000 mg | ORAL_TABLET | Freq: Three times a day (TID) | ORAL | Status: DC

## 2013-11-27 MED ORDER — DEXTROSE 5 % IV SOLN
1.0000 g | Freq: Once | INTRAVENOUS | Status: AC
  Administered 2013-11-27: 1 g via INTRAVENOUS
  Filled 2013-11-27: qty 10

## 2013-11-27 MED ORDER — CEPHALEXIN 500 MG PO CAPS: 500.0000 mg | ORAL_CAPSULE | Freq: Four times a day (QID) | ORAL | Status: DC

## 2013-11-27 MED ORDER — SODIUM CHLORIDE 0.9 % IV BOLUS (SEPSIS)
1000.0000 mL | Freq: Once | INTRAVENOUS | Status: AC
  Administered 2013-11-27: 1000 mL via INTRAVENOUS

## 2013-11-27 NOTE — ED Notes (Signed)
Phlebotomy at bedside.

## 2013-11-27 NOTE — ED Provider Notes (Signed)
CSN: [id number]     Arrival date & time 11/27/13  0810 History   First MD Initiated Contact with Patient 11/27/13 0813     Chief Complaint  Patient presents with  . Urinary Frequency     (Consider location/radiation/quality/duration/timing/severity/associated sxs/prior Treatment) HPI Comments: The patient is a pleasant 38 year old female with a history of diabetes and asthma, she has had a prior tubal ligation. She presents with gradual onset of suprapubic discomfort and a feeling of vaginal discomfort which started approximately 4-1/2 days ago according to the patient's report. She states that this is persistent, severe, associated with urinary frequency, she feels as though she can use the bathroom every 5 seconds, only a small amount of urine passes each time. She denies fevers chills nausea vomiting or flank pain. She states that she has even cut down on the amount of fluid that she is drinking but states that this does not help and she continues to urinate frequently. She has a feeling of having a foreign body sensation in her vagina, occasionally a stool with a bowel movement helps improve her symptoms.  The history is provided by the patient.    Past Medical History  Diagnosis Date  . Diabetes mellitus without complication   . Asthma    Past Surgical History  Procedure Laterality Date  . Tubal ligation     No family history on file. History  Substance Use Topics  . Smoking status: Current Some Day Smoker -- 0.50 packs/day for 30 years    Types: Cigarettes  . Smokeless tobacco: Not on file  . Alcohol Use: Yes     Comment: occasional   OB History   Grav Para Term Preterm Abortions TAB SAB Ect Mult Living   3 3 2 1      2      Review of Systems  All other systems reviewed and are negative.     Allergies  Latex  Home Medications   Prior to Admission medications   Medication Sig Start Date End Date Taking? Authorizing Provider  albuterol (PROVENTIL HFA;VENTOLIN  HFA) 108 (90 BASE) MCG/ACT inhaler Inhale 1 puff into the lungs every 6 (six) hours as needed for wheezing or shortness of breath.   Yes Historical Provider, MD  cephALEXin (KEFLEX) 500 MG capsule Take 1 capsule (500 mg total) by mouth 4 (four) times daily. 11/27/13   Johnna Acosta, MD  citalopram (CELEXA) 20 MG tablet Take 20 mg by mouth daily.    Historical Provider, MD  gabapentin (NEURONTIN) 100 MG capsule Take 200 mg by mouth at bedtime.    Historical Provider, MD   BP 115/55  Pulse 77  Temp(Src) 97.8 F (36.6 C) (Oral)  Resp 18  Ht 5' 5.5" (1.664 m)  Wt 211 lb (95.709 kg)  BMI 34.57 kg/m2  SpO2 97%  LMP 11/23/2013 Physical Exam  Nursing note and vitals reviewed. Constitutional: She appears well-developed and well-nourished.  Uncomfortable appearing  HENT:  Head: Normocephalic and atraumatic.  Mouth/Throat: Oropharynx is clear and moist. No oropharyngeal exudate.  Eyes: Conjunctivae and EOM are normal. Pupils are equal, round, and reactive to light. Right eye exhibits no discharge. Left eye exhibits no discharge. No scleral icterus.  Neck: Normal range of motion. Neck supple. No JVD present. No thyromegaly present.  Cardiovascular: Normal rate, regular rhythm, normal heart sounds and intact distal pulses.  Exam reveals no gallop and no friction rub.   No murmur heard. Pulmonary/Chest: Effort normal and breath sounds normal. No respiratory distress.  She has no wheezes. She has no rales.  Abdominal: Soft. Bowel sounds are normal. She exhibits no distension and no mass. There is no tenderness.  Minimally tender suprapubic abdomen  Genitourinary:  Chaperoned present for exam, normal appearing external genitalia, no foreign bodies in the vaginal vault, no tenderness over the cervix, suprapubic tenderness present, no bleeding, no discharge  Musculoskeletal: Normal range of motion. She exhibits no edema and no tenderness.  Lymphadenopathy:    She has no cervical adenopathy.   Neurological: She is alert. Coordination normal.  Skin: Skin is warm and dry. No rash noted. No erythema.  Psychiatric: She has a normal mood and affect. Her behavior is normal.    ED Course  Procedures (including critical care time) Labs Review Labs Reviewed  URINALYSIS, ROUTINE W REFLEX MICROSCOPIC - Abnormal; Notable for the following:    Color, Urine AMBER (*)    APPearance TURBID (*)    Glucose, UA 500 (*)    Hgb urine dipstick LARGE (*)    Bilirubin Urine SMALL (*)    Ketones, ur 15 (*)    Protein, ur >300 (*)    Leukocytes, UA MODERATE (*)    All other components within normal limits  URINE MICROSCOPIC-ADD ON - Abnormal; Notable for the following:    Squamous Epithelial / LPF FEW (*)    Bacteria, UA FEW (*)    All other components within normal limits  I-STAT CHEM 8, ED - Abnormal; Notable for the following:    Glucose, Bld 203 (*)    All other components within normal limits  WET PREP, GENITAL  GC/CHLAMYDIA PROBE AMP  PREGNANCY, URINE  CBC    Imaging Review No results found.    MDM   Final diagnoses:  Cystitis    The patient has evidence of urinary tract infection with urinary frequency as well as tenderness over that suprapubic area, concern for urinary tract infection is high, less likely to be pregnancy or other traumatic-related condition. Vital signs normal, check urinalysis and pelvic labs.  UTI present, otherwise labs unremarkable,   Pt informfed of proteinuria, will need recheck - express3es understanding.  Meds given in ED:  Medications  cefTRIAXone (ROCEPHIN) 1 g in dextrose 5 % 50 mL IVPB (0 g Intravenous Stopped 11/27/13 1030)  sodium chloride 0.9 % bolus 1,000 mL (0 mLs Intravenous Stopped 11/27/13 1148)    New Prescriptions   CEPHALEXIN (KEFLEX) 500 MG CAPSULE    Take 1 capsule (500 mg total) by mouth 4 (four) times daily.      Johnna Acosta, MD 11/27/13 [phone]

## 2013-11-27 NOTE — ED Notes (Signed)
Per patient  "just hurts and burns when I pee, I feel like its more than a UTI, been going on for 4 and a half days."   "I feel like my clitoris is being played with, but it's not, its weird."  She has a rag that she uses continuously to wipe after she urinates, there are some light pink splotches on it.  Nad noted, vss, alert and oriented.

## 2013-11-27 NOTE — Discharge Instructions (Signed)
Please call your doctor for a followup appointment within 24-48 hours. When you talk to your doctor please let them know that you were seen in the emergency department and have them acquire all of your records so that they can discuss the findings with you and formulate a treatment plan to fully care for your new and ongoing problems. ° ° °Emergency Department Resource Guide °1) Find a Doctor and Pay Out of Pocket °Although you won't have to find out who is covered by your insurance plan, it is a good idea to ask around and get recommendations. You will then need to call the office and see if the doctor you have chosen will accept you as a new patient and what types of options they offer for patients who are self-pay. Some doctors offer discounts or will set up payment plans for their patients who do not have insurance, but you will need to ask so you aren't surprised when you get to your appointment. ° °2) Contact Your Local Health Department °Not all health departments have doctors that can see patients for sick visits, but many do, so it is worth a call to see if yours does. If you don't know where your local health department is, you can check in your phone book. The CDC also has a tool to help you locate your state's health department, and many state websites also have listings of all of their local health departments. ° °3) Find a Walk-in Clinic °If your illness is not likely to be very severe or complicated, you may want to try a walk in clinic. These are popping up all over the country in pharmacies, drugstores, and shopping centers. They're usually staffed by nurse practitioners or physician assistants that have been trained to treat common illnesses and complaints. They're usually fairly quick and inexpensive. However, if you have serious medical issues or chronic medical problems, these are probably not your best option. ° °No Primary Care Doctor: °- Call [facility] at  832-8000 - they can help you  locate a primary care doctor that  accepts your insurance, provides certain services, etc. °- Physician Referral Service- 1-800-533-3463 ° °Chronic Pain Problems: °Organization         Address  Phone   Notes  °[facility]  (336) 297-2271 Patients need to be referred by their primary care doctor.  ° °Medication Assistance: °Organization         Address  Phone   Notes  °[place] [facility] [address] °[place], [place] [address] (336) 641-8030 --Must be a resident of [place] °-- Must have NO insurance coverage whatsoever (no Medicaid/ Medicare, etc.) °-- The pt. MUST have a primary care doctor that directs their care regularly and follows them in the community °  °[facility]  (866) 331-1348   °[employer]  (888) 892-1162   ° °Agencies that provide inexpensive medical care: °Organization         Address  Phone   Notes  °[facility]  (336) 832-8035   °[facility]    (336) 832-7272   °[facility] [address] °[place], [place] [address] (336) 832-4777   °[facility] [address], °[place] (336) 271-4999   °[facility]    (336) 373-0678   °[facility]    (336) 272-1050   °[facility] ° [address],  Phone:  (336)   832-4444, Fax:  (336) 832-4440 Hours of Operation:  9 am - 6 pm, M-F.  Also accepts Medicaid/Medicare and self-pay.  °[facility] ° [address], [place] Phone: (336) 832-3150, Fax: (336) [phone]. Hours of Operation:  8:30 am - 5:30 pm, M-F.  Also accepts Medicaid and self-pay.  °[facility] [place] [address], [place] Phone: (336) 878-6027   °[facility] [address], [place], [place] (336)723-1848, Ext. 123 Mondays & Thursdays: 7-9 AM.  First 15 patients are seen on a first come, first serve basis. °  ° °Medicaid-accepting [place]  Providers: ° °Organization         Address  Phone   Notes  °[facility] [address], [place] (336) 641-2100 Also accepts self-pay patients.  °[facility] [address], [place] ° (336) 856-9996   °[facility] [address], [place] (336) 288-8857   °[facility] [address] [place] [address], [place] (336) 299-7000   °Veita Bland [address], [place]  ° (336) 373-1557 Only accepts [place] Access Medicaid patients after they have their name applied to their card.  ° °Self-Pay (no insurance) in [place]: ° °Organization         Address  Phone   Notes  °Sickle Cell Patients, [facility] [address], [place] (336) 832-1970   °[facility] [address], [place] (336) 832-4400   °[facility] ° [address], [place] (336) 992-4800   °[facility]/Dr. Osei-Bonsu ° [address] [place] [address], [place] or [address], [place] (336) 841-8500 Phone number for both [place] and [place] locations is the same.  °[facility] [address], [place] (336) 299-0000   °[facility] [address] [place] [address], [place] or [address] (336) 852-7530 °(336) 878-2260   °[facility] [address], [place] (336) 350-1642, phone; (336) 294-5005, fax Sees patients 1st and 3rd Saturday of every month.  Must not qualify for public or private insurance (i.e. Medicaid, Medicare, [place] Health Choice, Veterans' Benefits) • Household income should be no more than 200% of the poverty level •The clinic cannot treat you if you are pregnant or think you are pregnant • Sexually transmitted diseases are not treated at the clinic.  ° ° °Dental Care: °Organization         Address  Phone  Notes  °[facility] [facility] [address], [place] (336) 641-6152 Accepts children up to age 21 who are enrolled in Medicaid or [place] Health Choice; pregnant women with a Medicaid card; and children who have applied for Medicaid or [place] Health Choice, but were declined, whose parents can pay a reduced fee at time of service.  °[facility] [place]  [address], [place] (336) 641-7733 Accepts children up to age 21 who are enrolled in Medicaid or [place] Health Choice; pregnant women with a Medicaid card; and children who have applied for Medicaid or  Health Choice, but were declined, whose parents can pay a reduced fee at time of service.  °[facility] ° [address], [place] (336) 641-4533 Patients are seen by appointment only. Walk-ins are   not accepted. [facility] will see patients 18 years of age and older. °Monday - Tuesday (8am-5pm) °Most Wednesdays (8:30-5pm) °$30 per visit, cash only  °[facility] ° [address], [place] (336) 641-4533 Patients are seen by appointment only. Walk-ins are not accepted. [facility] will see patients 18 years of age and older. °One Wednesday Evening (Monthly: Volunteer Based).  $30 per visit, cash only  °[facility]  (919) 537-3737 for adults; Children under age 4, call Graduate Pediatric Dentistry at (919) [phone]. Children aged 4-14, please call (919) [phone] to request a pediatric application. ° Dental services are provided in all areas of dental care including fillings, crowns and bridges, complete and partial dentures, implants, gum treatment, root canals, and extractions. Preventive care is also provided. Treatment is provided to both adults and children. °Patients are selected via a lottery and there is often a waiting list. °  °[facility] [address], °[place] ° (336) 763-8833 [email or web address] °  °[facility]  [address], [place], [place] (336)723-1848, Ext. 123 Second and Fourth Thursday of each month, opens at 6:30 AM; Clinic ends at 9 AM.  Patients are seen on a first-come first-served basis, and a limited number are seen during each clinic.  ° °[facility] ° [address], [place], [place] (336) 723-7904   Eligibility Requirements °You must have lived in [place], or [place] counties for at least the last three months. °  You cannot be eligible for state or federal sponsored healthcare insurance, including Veterans Administration, Medicaid, or Medicare. °  You generally cannot be eligible for healthcare insurance through your employer.  °  How to apply: °Eligibility screenings are held every Tuesday and Wednesday afternoon from 1:00 pm until 4:00 pm. You do not need an appointment for the interview!  °[facility] [address], [place], [place] 336-631-2330   °[facility]  336-342-8273   °[facility]  336-703-3100   °[facility]  336-570-6415   ° °Behavioral Health Resources in the Community: °Intensive Outpatient Programs °Organization         Address  Phone  Notes  °[facility] [address], [place], [place] 336-878-6098   °[facility] [address], [place], [place] 336-832-9800   °[facility] [address], [place], [place] ° 336-882-2125   °[facility] [address],  °[place], [place] 1-800-853-5163 or 336-641-4981   °Substance Abuse Resources °Organization         Address  Phone  Notes  °[facility]  336-882-2125   °[facility]  336-784-9470   °[facility]  336-285-9073   °[facility]  336-845-3988   °Residential & Outpatient Substance Abuse Program  1-800-659-3381   °Psychological Services °Organization         Address  Phone  Notes  °[facility]  336- 832-9600     °[facility]  336- 378-7881   °[facility] [address], [place] 1-800-853-5163 or 336-641-4981   ° °Mobile Crisis Teams °Organization         Address  Phone  Notes  °[facility], Mobile Crisis Care Unit  1-877-626-1772   °Assertive °[facility] ° [address] [place], [place] 336-834-9664   °Sharon DeEsch [address] °[place] 336-554-5454   ° °  Self-Help/Support Groups Organization         Address  Phone             Notes  [facility] [place] - variety of support groups  [place] Call for more information  Narcotics Anonymous (NA), [facility] [address], [employer] [place]  2 meetings at this location   Special educational needs teacher         Address  Phone  Notes  [facility] [place],    [place]  1-607-539-2173   [facility]  [address], [place] [address], [place], [place]   [place], [place] [phone] Admissions: 8am-3pm M-F  [facility] [place] [address]    [place], [place] [phone]   [facility] [address] [place], [place], [place]   [facility] [address]  [place], [place]   [facility] Intensive Outpatient [address], [place], [place]   [facility] ([place]  [place], [place] 1-864-031-3877 or (607)417-3392   [facility] [address], [place], [place] Accepts Medicaid  [facility] [address],  [place] 1-(740) 015-9159 Substance Abuse/Addiction Treatment   [facility]         Address  Phone  Notes  [facility]  301 686 2936   Domenic Schwab, PhD [address] Arlis Porta [place], [place]   510-642-0779 or [phone]    [facility] [place], [place] [phone]   [facility] [address], [place], [place] 520-709-0134 Insurance/Medicaid/sponsorship through [facility] [address] [place] [address]                                    [place], [place] (442)[phone] [place] [address] [place] [address], [place] [phone]    Dr. Adele Schilder  [phone]   [facility] Dept. 1) [address] [place] 2) [place] 3)  [address], [place] ([phone]  [phone]   [place] [phone] or [phone] (After Hours)

## 2013-11-27 NOTE — ED Notes (Signed)
Phlebotomy called to redraw CBC

## 2013-11-27 NOTE — ED Notes (Signed)
Patient ambulating to bathroom very frequently to urinate

## 2013-11-28 LAB — GC/CHLAMYDIA PROBE AMP
CT Probe RNA: NEGATIVE
GC Probe RNA: POSITIVE — AB

## 2013-11-29 ENCOUNTER — Telehealth (HOSPITAL_COMMUNITY): Payer: Self-pay

## 2013-11-29 NOTE — ED Notes (Signed)
Positive for gonorrhea. Chart sent to [place] office for review.

## 2013-11-30 ENCOUNTER — Telehealth (HOSPITAL_COMMUNITY): Payer: Self-pay

## 2013-11-30 NOTE — ED Notes (Signed)
spoke with pt. informed of lab results. advised to notify partner for testing and treatment.  per [facility] azithromax 1 gram called [facility] [address]. left on voicemail

## 2013-12-05 ENCOUNTER — Encounter (HOSPITAL_COMMUNITY): Payer: Self-pay | Admitting: Emergency Medicine

## 2013-12-08 ENCOUNTER — Telehealth: Payer: Self-pay | Admitting: *Deleted

## 2013-12-08 NOTE — Telephone Encounter (Signed)
Pt placed call to office stating she doesn't feel well after recent visit to [facility]. Attempt to contact pt, no answer no voicemail.

## 2013-12-13 ENCOUNTER — Telehealth: Payer: Self-pay | Admitting: *Deleted

## 2013-12-13 ENCOUNTER — Ambulatory Visit: Payer: Medicaid Other | Admitting: *Deleted

## 2013-12-13 VITALS — BP 126/89 | HR 111 | Wt 230.0 lb

## 2013-12-13 DIAGNOSIS — B009 Herpesviral infection, unspecified: Secondary | ICD-10-CM

## 2013-12-13 DIAGNOSIS — A549 Gonococcal infection, unspecified: Secondary | ICD-10-CM

## 2013-12-13 DIAGNOSIS — B379 Candidiasis, unspecified: Secondary | ICD-10-CM

## 2013-12-13 DIAGNOSIS — T3695XA Adverse effect of unspecified systemic antibiotic, initial encounter: Secondary | ICD-10-CM

## 2013-12-13 MED ORDER — FLUCONAZOLE 150 MG PO TABS: 150.0000 mg | ORAL_TABLET | Freq: Once | ORAL | Status: DC

## 2013-12-13 MED ORDER — VALACYCLOVIR HCL 500 MG PO TABS: 500.0000 mg | ORAL_TABLET | Freq: Every day | ORAL | Status: DC

## 2013-12-13 MED ORDER — CEFTRIAXONE SODIUM 1 G IJ SOLR: 250.0000 mg | Freq: Once | INTRAMUSCULAR | Status: DC

## 2013-12-13 NOTE — Telephone Encounter (Signed)
Attempt to contact pt in order to schedule f/u appt.  No answer, left message for pt to contact office. Pt needs f/u in 2-3 months for TOC.  If medication is not helping current symptoms in the next couple of weeks pt will need earlier visit.

## 2013-12-13 NOTE — Telephone Encounter (Signed)
FYI: Pt was seen in office for nurse visit today.  Pt was to possibly get Rocephin injection for recent +GC.  In review of chart pt was given Rocephin at [facility] at time of visit via IV for UTI.  Pt was also treated with Azithromycin at time of visit as well.  Pt states that she is still feeling itch/irritation in her vaginal/clitoral area.  Pt made aware that Diflucan would be sent to pharmacy as well as refill on Valtrex as she request.  Pt advised that she may want to take Valtrex daily for suppression and increase during outbreak. Pt advised that she needs to make a follow up appt.  Pt advised to contact office with any concerns.  Please review for any further recommendation, offer of HIV testing at next visit?Marland Kitchen

## 2013-12-13 NOTE — Progress Notes (Signed)
Pt was in office today for nurse follow up from recent hospital visit.  It was advised that pt may need Rocephin injection for +GC.  It was noted that pt did receive Rocephin via IV at time of hospital visit.  Pt was advised that the antibiotic that she was given should treat her +GC as well as the antibiotic she was given Rx for.  Pt advised that she would be sent a Rx for Diflucan for yeast symptoms and a refill of her Valtrex would be sent as well.  Pt advised to contact office if any concerns with Rx.  Pt made aware that she will be contacted later today to schedule f/u appt.

## 2013-12-23 NOTE — Telephone Encounter (Signed)
Can offer HIV testing at next visit

## 2013-12-23 NOTE — Telephone Encounter (Signed)
Pt has been in office since call placed. Call to be closed.

## 2014-01-30 ENCOUNTER — Encounter: Payer: Self-pay | Admitting: *Deleted

## 2014-01-31 ENCOUNTER — Encounter: Payer: Self-pay | Admitting: Obstetrics & Gynecology

## 2014-03-01 NOTE — Telephone Encounter (Signed)
Have attempted to contact pt with no success. Will offer testing when pt comes to office in future. Call to be filed.

## 2014-03-06 ENCOUNTER — Ambulatory Visit (INDEPENDENT_AMBULATORY_CARE_PROVIDER_SITE_OTHER): Payer: Medicaid Other | Admitting: Obstetrics

## 2014-03-06 VITALS — BP 132/90 | HR 113 | Wt 230.0 lb

## 2014-03-06 DIAGNOSIS — B3731 Acute candidiasis of vulva and vagina: Secondary | ICD-10-CM

## 2014-03-06 DIAGNOSIS — Z113 Encounter for screening for infections with a predominantly sexual mode of transmission: Secondary | ICD-10-CM

## 2014-03-06 DIAGNOSIS — A609 Anogenital herpesviral infection, unspecified: Secondary | ICD-10-CM

## 2014-03-06 DIAGNOSIS — A6009 Herpesviral infection of other urogenital tract: Secondary | ICD-10-CM

## 2014-03-06 DIAGNOSIS — N393 Stress incontinence (female) (male): Secondary | ICD-10-CM

## 2014-03-06 DIAGNOSIS — E139 Other specified diabetes mellitus without complications: Secondary | ICD-10-CM

## 2014-03-06 DIAGNOSIS — B373 Candidiasis of vulva and vagina: Secondary | ICD-10-CM

## 2014-03-06 LAB — RPR

## 2014-03-06 MED ORDER — CLOTRIMAZOLE 1 % EX CREA: 1.0000 "application " | TOPICAL_CREAM | Freq: Two times a day (BID) | CUTANEOUS | Status: DC

## 2014-03-06 MED ORDER — FLUCONAZOLE 200 MG PO TABS: 200.0000 mg | ORAL_TABLET | ORAL | Status: AC

## 2014-03-06 MED ORDER — VALACYCLOVIR HCL 1 G PO TABS: 1000.0000 mg | ORAL_TABLET | Freq: Every day | ORAL | Status: AC

## 2014-03-07 ENCOUNTER — Encounter: Payer: Self-pay | Admitting: Obstetrics

## 2014-03-07 ENCOUNTER — Telehealth: Payer: Self-pay

## 2014-03-07 LAB — HIV ANTIBODY (ROUTINE TESTING W REFLEX): HIV 1&2 Ab, 4th Generation: NONREACTIVE

## 2014-03-07 LAB — HEPATITIS C ANTIBODY: HCV AB: NEGATIVE

## 2014-03-07 LAB — HEPATITIS B SURFACE ANTIGEN: Hepatitis B Surface Ag: NEGATIVE

## 2014-03-07 NOTE — Telephone Encounter (Signed)
let patient know she had appt with [facility], Dr. Bjorn Loser, on 03/20/14 at [place]

## 2014-03-07 NOTE — Progress Notes (Signed)
  Patient ID: Erika Henson, female   DOB: November 08, 1975, 39 y.o.   MRN: [id number]  Chief Complaint  Patient presents with  . Personal Problem    HPI Erika Henson is a 39 y.o. female.  Severe vaginal itching.  Incontinence with coughing, sneezing, etc.  Has had more frequent herpes outbreaks.  HPI  Past Medical History  Diagnosis Date  . Diabetes mellitus without complication   . Asthma     Past Surgical History  Procedure Laterality Date  . Tubal ligation      History reviewed. No pertinent family history.  Social History History  Substance Use Topics  . Smoking status: Current Some Day Smoker -- 0.50 packs/day for 30 years    Types: Cigarettes  . Smokeless tobacco: Not on file  . Alcohol Use: Yes     Comment: occasional    Allergies  Allergen Reactions  . Latex     Breaks out    Current Outpatient Prescriptions  Medication Sig Dispense Refill  . albuterol (PROVENTIL HFA;VENTOLIN HFA) 108 (90 BASE) MCG/ACT inhaler Inhale 1 puff into the lungs every 6 (six) hours as needed for wheezing or shortness of breath.    . citalopram (CELEXA) 20 MG tablet Take 20 mg by mouth daily.    . clotrimazole (LOTRIMIN) 1 % cream Apply 1 application topically 2 (two) times daily. 30 g 0  . fluconazole (DIFLUCAN) 200 MG tablet Take 1 tablet (200 mg total) by mouth every other day. 3 tablet 5  . gabapentin (NEURONTIN) 100 MG capsule Take 200 mg by mouth at bedtime.    . valACYclovir (VALTREX) 1000 MG tablet Take 1 tablet (1,000 mg total) by mouth daily. For suppression. 30 tablet 11   No current facility-administered medications for this visit.     Review of Systems Review of Systems Constitutional: negative for fatigue and weight loss Respiratory: negative for cough and wheezing Cardiovascular: negative for chest pain, fatigue and palpitations Gastrointestinal: negative for abdominal pain and change in bowel habits Genitourinary:  Severe vaginal itching.  Recurrent herpes outbreaks.   Urine leakage with cough and any valsalva. Integument/breast: negative for nipple discharge Musculoskeletal:negative for myalgias Neurological: negative for gait problems and tremors Behavioral/Psych: negative for abusive relationship, depression Endocrine: negative for temperature intolerance     Blood pressure 132/90, pulse 113, weight 230 lb (104.327 kg), last menstrual period 02/06/2014.  Physical Exam Physical Exam General:   alert  Skin:   no rash or abnormalities  Lungs:   clear to auscultation bilaterally  Heart:   regular rate and rhythm, S1, S2 normal, no murmur, click, rub or gallop  Breasts:   normal without suspicious masses, skin or nipple changes or axillary nodes  Abdomen:  normal findings: no organomegaly, soft, non-tender and no hernia  Pelvis:  External genitalia: normal general appearance.  Excoriations and white discharge Urinary system: urethral meatus normal and bladder without fullness, nontender Vaginal: normal without tenderness, induration or masses.  White discharge Cervix: normal appearance Adnexa: normal bimanual exam Uterus: anteverted and non-tender, normal size    Data Reviewed Labs  Assessment    Candida vulvovaginitis     Plan:       Diflucan Rx      Clotrimazole Rx      Referred to Endocrinologist      Referred to Urologist

## 2014-03-09 LAB — SURESWAB, VAGINOSIS/VAGINITIS PLUS
Atopobium vaginae: DETECTED Log (cells/mL)
BV CATEGORY: UNDETERMINED — AB
C. ALBICANS, DNA: DETECTED — AB
C. PARAPSILOSIS, DNA: NOT DETECTED
C. glabrata, DNA: NOT DETECTED
C. trachomatis RNA, TMA: NOT DETECTED
C. tropicalis, DNA: NOT DETECTED
GARDNERELLA VAGINALIS: 6.5 Log (cells/mL)
LACTOBACILLUS SPECIES: 6 Log (cells/mL)
MEGASPHAERA SPECIES: NOT DETECTED Log (cells/mL)
N. GONORRHOEAE RNA, TMA: DETECTED — AB
T. vaginalis RNA, QL TMA: NOT DETECTED

## 2014-03-10 ENCOUNTER — Other Ambulatory Visit: Payer: Self-pay | Admitting: Obstetrics

## 2014-03-10 ENCOUNTER — Other Ambulatory Visit: Payer: Self-pay | Admitting: *Deleted

## 2014-03-10 DIAGNOSIS — A5403 Gonococcal cervicitis, unspecified: Secondary | ICD-10-CM

## 2014-03-10 DIAGNOSIS — B3731 Acute candidiasis of vulva and vagina: Secondary | ICD-10-CM

## 2014-03-10 DIAGNOSIS — B373 Candidiasis of vulva and vagina: Secondary | ICD-10-CM

## 2014-03-10 DIAGNOSIS — B9689 Other specified bacterial agents as the cause of diseases classified elsewhere: Secondary | ICD-10-CM

## 2014-03-10 DIAGNOSIS — N760 Acute vaginitis: Secondary | ICD-10-CM

## 2014-03-10 MED ORDER — AZITHROMYCIN 250 MG PO TABS: 1000.0000 mg | ORAL_TABLET | Freq: Once | ORAL | Status: AC

## 2014-03-10 MED ORDER — CEFTRIAXONE SODIUM 250 MG IJ SOLR: 250.0000 mg | Freq: Once | INTRAMUSCULAR | Status: AC

## 2014-03-10 MED ORDER — METRONIDAZOLE 500 MG PO TABS: 500.0000 mg | ORAL_TABLET | Freq: Two times a day (BID) | ORAL | Status: AC

## 2014-03-10 MED ORDER — FLUCONAZOLE 150 MG PO TABS: 150.0000 mg | ORAL_TABLET | Freq: Once | ORAL | Status: DC

## 2014-03-13 ENCOUNTER — Telehealth: Payer: Self-pay

## 2014-03-13 NOTE — Telephone Encounter (Signed)
Tried to get patient another appt that was sooner than Dr. Delrae Rend could see her. [facility] could not see her - called Dr. Cindra Eves office and they will put her on cancellation list if something comes up sooner

## 2014-03-13 NOTE — Telephone Encounter (Signed)
Spoke with patient and gave her number to [facility] - Dr Katina Degree office - explained that [place] [facility] cannot see her, she will check with Dr. Cindra Eves office for cancellations to get in sooner.

## 2014-03-17 ENCOUNTER — Ambulatory Visit: Payer: Medicaid Other | Admitting: *Deleted

## 2014-03-17 VITALS — BP 118/76 | HR 83

## 2014-03-17 DIAGNOSIS — A549 Gonococcal infection, unspecified: Secondary | ICD-10-CM

## 2014-03-17 DIAGNOSIS — A5409 Other gonococcal infection of lower genitourinary tract: Secondary | ICD-10-CM | POA: Diagnosis not present

## 2014-03-17 MED ORDER — CEFTRIAXONE SODIUM 1 G IJ SOLR
250.0000 mg | Freq: Once | INTRAMUSCULAR | Status: AC
  Administered 2014-03-17: 250 mg via INTRAMUSCULAR

## 2014-03-17 NOTE — Progress Notes (Unsigned)
Pt is in office today for Rocephin injection for +GC.  Pt states that she will pick up Rx at pharmacy today as well.  Pt states that her partner was treated yesterday.  Pt strongly advised to abstain from intercourse for 2 weeks after treatment and be seen in office for retesting in 2-3 months.  Pt states understanding.    BP 118/76 mmHg  Pulse 83  LMP 02/06/2014   Administrations This Visit    cefTRIAXone (ROCEPHIN) injection 250 mg    Admin Date Action Dose Route Administered By         03/17/2014 Given 250 mg Intramuscular Valene Bors, CMA

## 2014-06-05 ENCOUNTER — Ambulatory Visit (INDEPENDENT_AMBULATORY_CARE_PROVIDER_SITE_OTHER): Payer: Medicaid Other | Admitting: Obstetrics

## 2014-06-05 VITALS — BP 123/87 | HR 110 | Temp 98.6°F | Wt 232.0 lb

## 2014-06-05 DIAGNOSIS — B373 Candidiasis of vulva and vagina: Secondary | ICD-10-CM | POA: Diagnosis not present

## 2014-06-05 DIAGNOSIS — Z113 Encounter for screening for infections with a predominantly sexual mode of transmission: Secondary | ICD-10-CM

## 2014-06-05 DIAGNOSIS — B3731 Acute candidiasis of vulva and vagina: Secondary | ICD-10-CM

## 2014-06-05 MED ORDER — CLOTRIMAZOLE 1 % EX CREA: 1.0000 "application " | TOPICAL_CREAM | Freq: Two times a day (BID) | CUTANEOUS | Status: DC

## 2014-06-06 ENCOUNTER — Encounter: Payer: Self-pay | Admitting: Obstetrics

## 2014-06-06 NOTE — Progress Notes (Signed)
Patient ID: Erika Henson, female   DOB: 01/14/1976, 39 y.o.   MRN: [id number]  Chief Complaint  Patient presents with  . Follow-up    TOC    HPI [name] is a 39 y.o. female.  H/O vulvovaginal yeast.  Presents for F/U.  Still having itching on the " outside ".  HPI  Past Medical History  Diagnosis Date  . Diabetes mellitus without complication   . Asthma     Past Surgical History  Procedure Laterality Date  . Tubal ligation      History reviewed. No pertinent family history.  Social History History  Substance Use Topics  . Smoking status: Current Some Day Smoker -- 0.50 packs/day for 30 years    Types: Cigarettes  . Smokeless tobacco: Not on file  . Alcohol Use: Yes     Comment: occasional    Allergies  Allergen Reactions  . Latex     Breaks out    Current Outpatient Prescriptions  Medication Sig Dispense Refill  . albuterol (PROVENTIL HFA;VENTOLIN HFA) 108 (90 BASE) MCG/ACT inhaler Inhale 1 puff into the lungs every 6 (six) hours as needed for wheezing or shortness of breath.    . citalopram (CELEXA) 20 MG tablet Take 20 mg by mouth daily.    Marland Kitchen gabapentin (NEURONTIN) 100 MG capsule Take 200 mg by mouth at bedtime.    . valACYclovir (VALTREX) 1000 MG tablet Take 1 tablet (1,000 mg total) by mouth daily. For suppression. 30 tablet 11  . azithromycin (ZITHROMAX) 250 MG tablet Take 4 tablets (1,000 mg total) by mouth once. (Patient not taking: Reported on 06/05/2014) 4 tablet 0  . cefTRIAXone (ROCEPHIN) 250 MG injection Inject 250 mg into the muscle once.  FOR IM use in LARGE MUSCLE MASS (Patient not taking: Reported on 06/05/2014) 1 each 0  . clotrimazole (LOTRIMIN) 1 % cream Apply 1 application topically 2 (two) times daily. 60 g 2  . fluconazole (DIFLUCAN) 200 MG tablet Take 1 tablet (200 mg total) by mouth every other day. (Patient not taking: Reported on 06/05/2014) 3 tablet 5  . metroNIDAZOLE (FLAGYL) 500 MG tablet Take 1 tablet (500 mg total) by mouth 2 (two) times  daily. (Patient not taking: Reported on 06/05/2014) 14 tablet 2   No current facility-administered medications for this visit.    Review of Systems Review of Systems Constitutional: negative for fatigue and weight loss Respiratory: negative for cough and wheezing Cardiovascular: negative for chest pain, fatigue and palpitations Gastrointestinal: negative for abdominal pain and change in bowel habits Genitourinary: vulva itching Integument/breast: negative for nipple discharge Musculoskeletal:negative for myalgias Neurological: negative for gait problems and tremors Behavioral/Psych: negative for abusive relationship, depression Endocrine: negative for temperature intolerance     Blood pressure 123/87, pulse 110, temperature 98.6 F (37 C), weight 232 lb (105.235 kg), last menstrual period 05/15/2014.  Physical Exam Physical Exam:                   Pelvis:  External genitalia: normal general appearance Urinary system: urethral meatus normal and bladder without fullness, nontender Vaginal: normal without tenderness, induration or masses Cervix: normal appearance       Data Reviewed Labs  Assessment     Candida Vulvitis.     Plan    Ciclopirox Rx F/U in 3 months.  Orders Placed This Encounter  Procedures  . SureSwab, Vaginosis/Vaginitis Plus   Meds ordered this encounter  Medications  . clotrimazole (LOTRIMIN) 1 % cream    Sig:  Apply 1 application topically 2 (two) times daily.    Dispense:  60 g    Refill:  2

## 2014-06-08 LAB — SURESWAB, VAGINOSIS/VAGINITIS PLUS
Atopobium vaginae: NOT DETECTED Log (cells/mL)
BV CATEGORY: UNDETERMINED — AB
C. GLABRATA, DNA: NOT DETECTED
C. PARAPSILOSIS, DNA: NOT DETECTED
C. TRACHOMATIS RNA, TMA: NOT DETECTED
C. TROPICALIS, DNA: NOT DETECTED
C. albicans, DNA: DETECTED — AB
Gardnerella vaginalis: 6.7 Log (cells/mL)
LACTOBACILLUS SPECIES: 5.8 Log (cells/mL)
MEGASPHAERA SPECIES: NOT DETECTED Log (cells/mL)
N. gonorrhoeae RNA, TMA: NOT DETECTED
T. vaginalis RNA, QL TMA: NOT DETECTED

## 2014-06-09 ENCOUNTER — Other Ambulatory Visit: Payer: Self-pay | Admitting: Obstetrics

## 2014-09-05 ENCOUNTER — Ambulatory Visit: Payer: Medicaid Other | Admitting: Obstetrics

## 2014-10-12 ENCOUNTER — Ambulatory Visit: Payer: Medicaid Other | Admitting: Obstetrics

## 2014-12-18 ENCOUNTER — Other Ambulatory Visit: Payer: Self-pay | Admitting: Obstetrics

## 2014-12-18 DIAGNOSIS — B373 Candidiasis of vulva and vagina: Secondary | ICD-10-CM

## 2014-12-18 DIAGNOSIS — B3731 Acute candidiasis of vulva and vagina: Secondary | ICD-10-CM

## 2014-12-18 MED ORDER — CLOTRIMAZOLE 1 % EX CREA: 1.0000 "application " | TOPICAL_CREAM | Freq: Two times a day (BID) | CUTANEOUS | Status: DC

## 2015-03-02 ENCOUNTER — Other Ambulatory Visit: Payer: Self-pay | Admitting: Obstetrics

## 2015-03-02 DIAGNOSIS — B369 Superficial mycosis, unspecified: Secondary | ICD-10-CM

## 2015-03-05 NOTE — Progress Notes (Signed)
Rx was sent to pharmacy by provider  

## 2015-04-14 IMAGING — US US TRANSVAGINAL NON-OB
1 series · 13 of 25 positions shown · non-contrast
Comparison: None

CLINICAL DATA: Secondary dysmenorrhea irregular cycles, history
tubal ligation, smoking



[Series 1: us transvaginal non-ob · 13 of 89 slices shown]
[im 1/89]
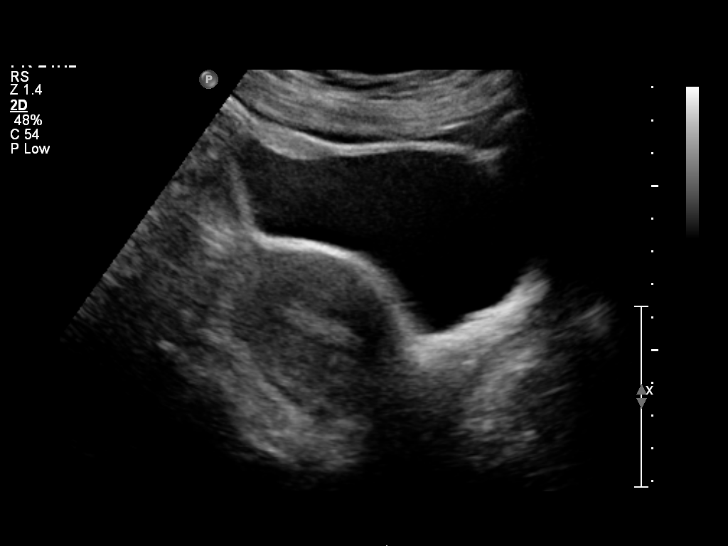
[im 8/89]
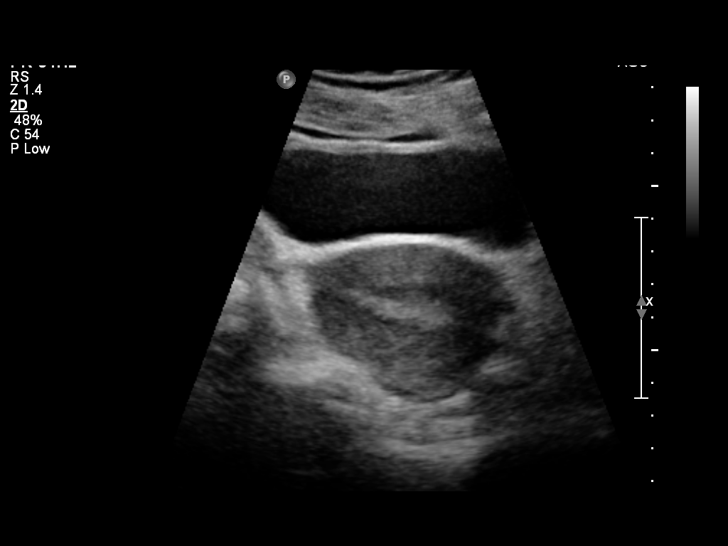
[im 15/89]
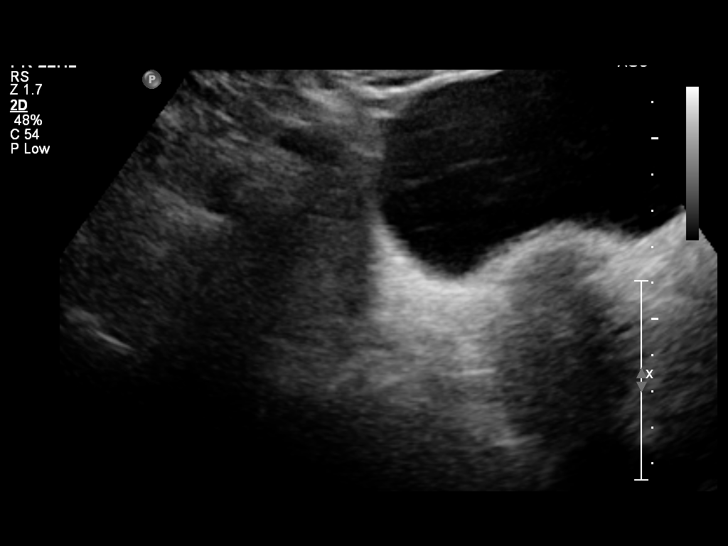
[im 23/89]
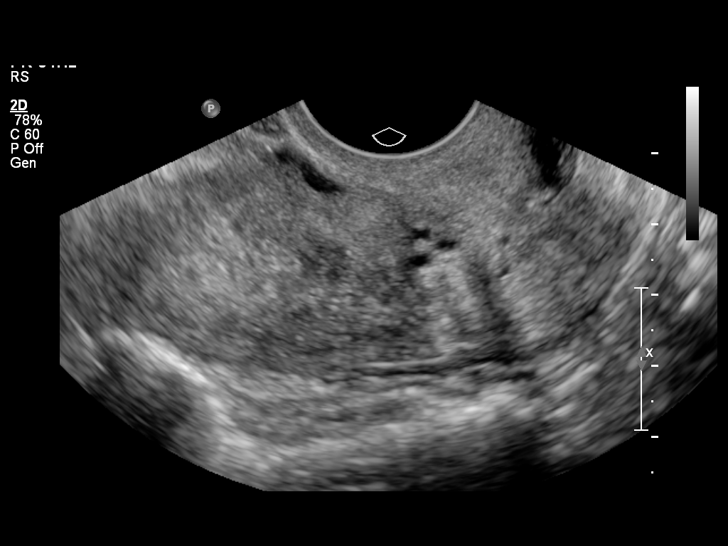
[im 30/89]
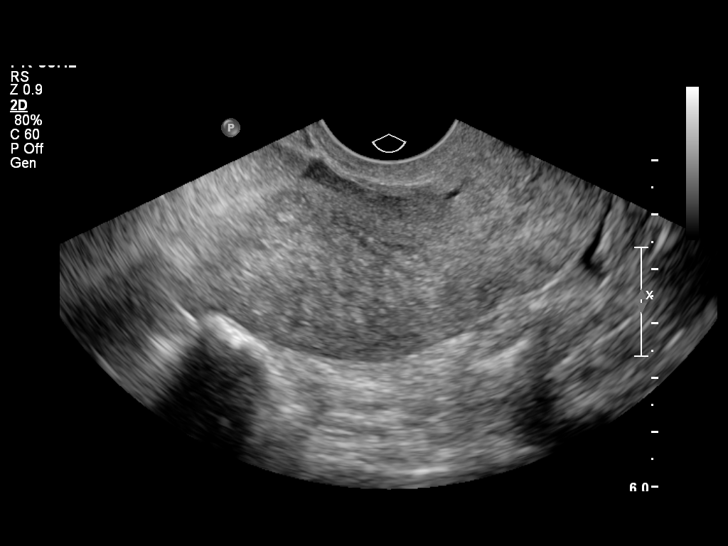
[im 37/89]
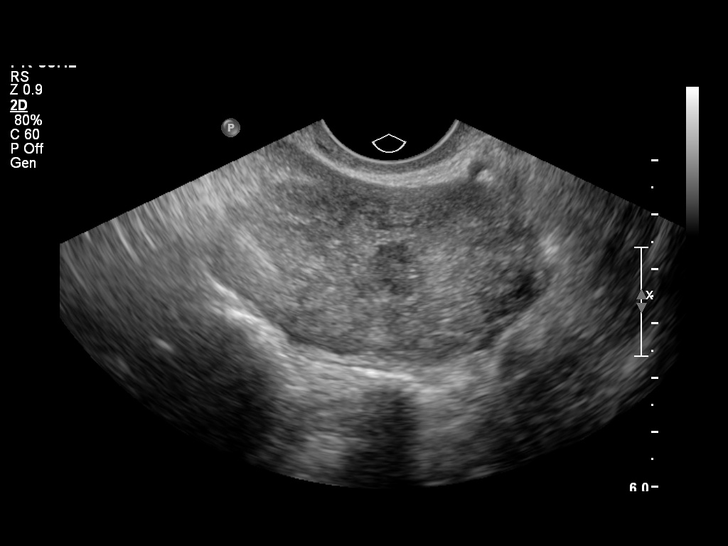
[im 45/89]
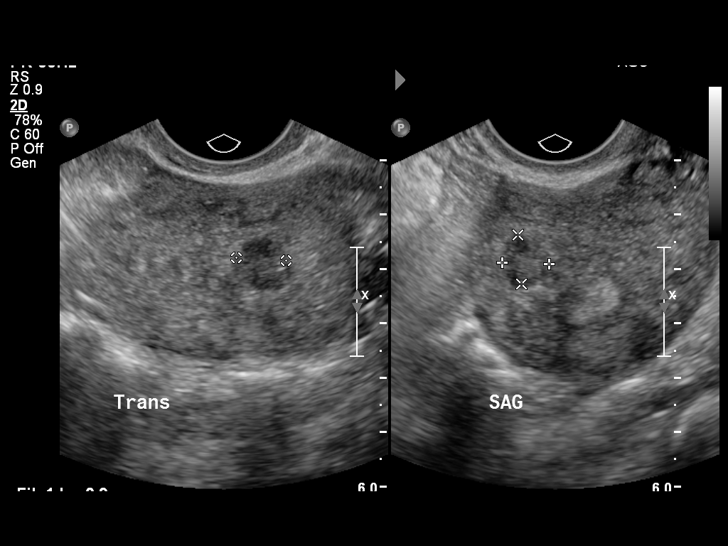
[im 52/89]
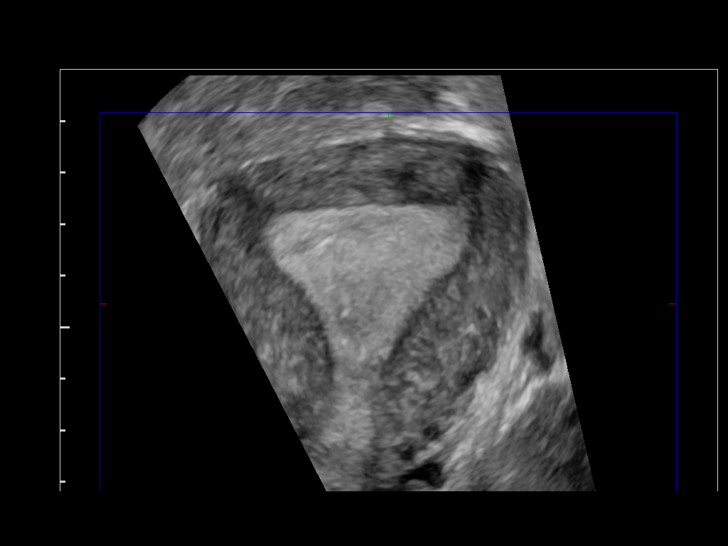
[im 59/89]
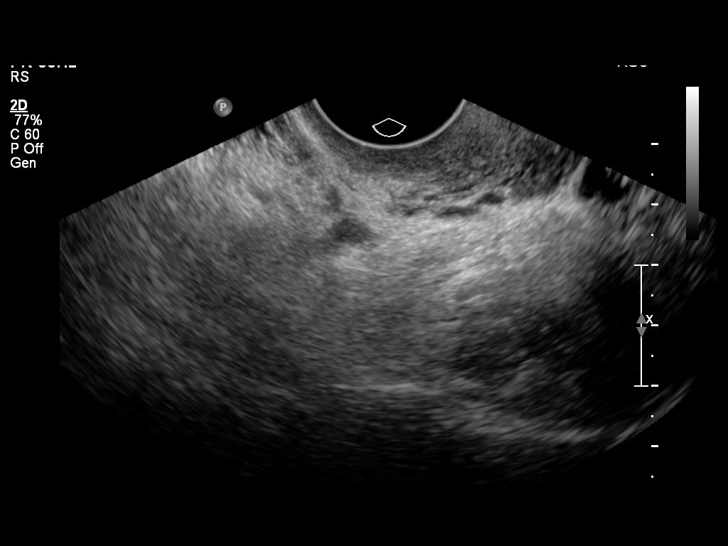
[im 67/89]
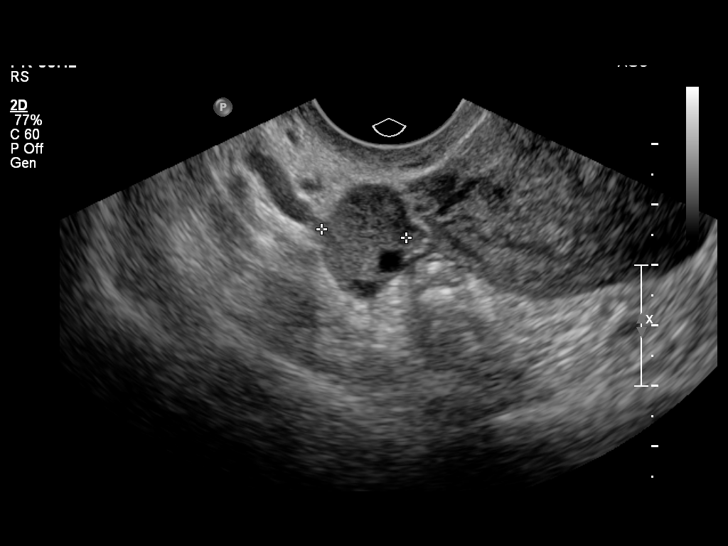
[im 74/89]
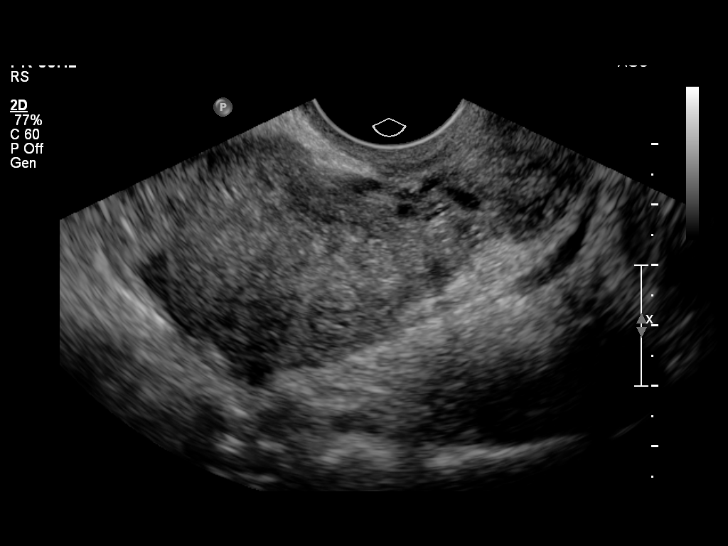
[im 81/89]
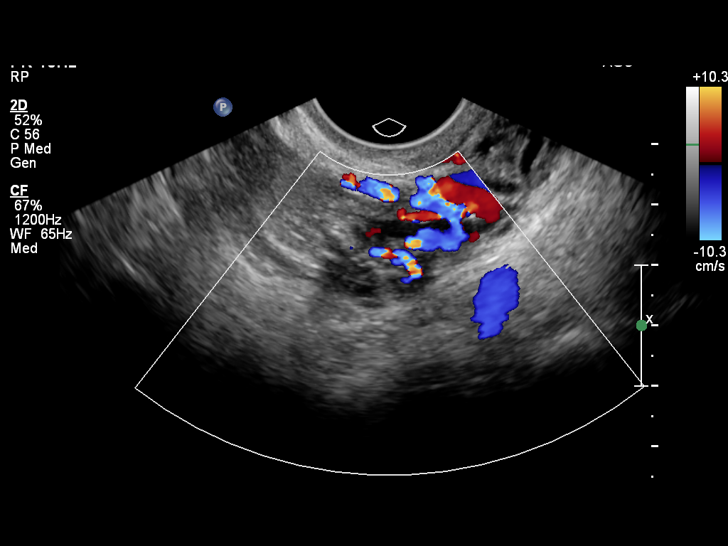
[im 89/89]
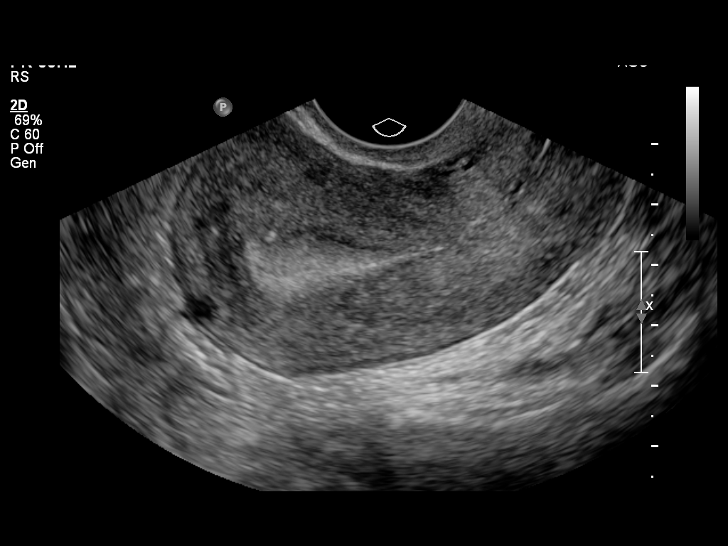

[13 of 25 positions shown; findings below may reference images not displayed]

FINDINGS: Uterus

Measurements: 8.1 x 4.4 x 6.4 cm. Large nabothian cyst 12 mm
diameter at cervix containing debris/internal echogenicity. Tiny
uterine leiomyomata noted, upper uterine segment, 9 mm and 6 mm in
greatest sizes.

Endometrium

Thickness: 10 mm thick, normal. Tiny amount of endometrial fluid. No
additional focal endometrial abnormality.

Right ovary

Measurements: 2.3 x 1.6 x 1.4 cm. Normal morphology without mass.

Left ovary

Measurements: 3.1 x 2.3 x 1.9 cm. Normal morphology without mass.

Other findings

No free pelvic fluid or adnexal masses.
IMPRESSION: Tiny uterine leiomyomata.

Tiny amount of nonspecific endometrial fluid.

Otherwise negative exam.

## 2015-07-19 ENCOUNTER — Ambulatory Visit: Payer: Medicaid Other | Admitting: Obstetrics

## 2015-09-05 ENCOUNTER — Other Ambulatory Visit (HOSPITAL_COMMUNITY): Payer: Self-pay | Admitting: *Deleted

## 2015-09-05 DIAGNOSIS — N644 Mastodynia: Secondary | ICD-10-CM

## 2015-09-10 ENCOUNTER — Ambulatory Visit: Payer: Medicaid Other | Attending: Podiatry | Admitting: Podiatry

## 2015-09-10 DIAGNOSIS — G629 Polyneuropathy, unspecified: Secondary | ICD-10-CM

## 2015-09-10 NOTE — Progress Notes (Signed)
Subjective:     Patient ID: Erika Henson, female   DOB: 06/24/1975, 40 y.o.   MRN: [id number]  HPI 40 year old female presents to the office today concerns of numbness to her toes. She states this has been ongoing for quite some time. No recent injury or trauma. She is on gabapentin and other medications but she states she does not take her medication regularly. No other complaints.   Review of Systems  All other systems reviewed and are negative.      Objective:   Physical Exam General: AAO x3, NAD  Dermatological: Skin is warm, dry and supple bilateral. Nails x 10 are well manicured; remaining integument appears unremarkable at this time. There are no open sores, no preulcerative lesions, no rash or signs of infection present.  Vascular: Dorsalis Pedis artery and Posterior Tibial artery pedal pulses are 2/4 bilateral with immedate capillary fill time.  There is no pain with calf compression, swelling, warmth, erythema.   Neurological: Subjective numbness to her toes.   Musculoskeletal: No gross boney pedal deformities bilateral. No pain, crepitus, or limitation noted with foot and ankle range of motion bilateral. Muscular strength 5/5 in all groups tested bilateral.  Gait: Unassisted, Nonantalgic.      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 Will try topical capsicin cream.  -Blood glucose control -Also follow-up with PCP   Celesta Gentile, DPM

## 2015-09-27 ENCOUNTER — Ambulatory Visit (HOSPITAL_COMMUNITY)
Admission: RE | Admit: 2015-09-27 | Discharge: 2015-09-27 | Disposition: A | Discharge status: Home / Self Care | Payer: Self-pay | Source: Ambulatory Visit | Attending: Obstetrics and Gynecology | Admitting: Obstetrics and Gynecology

## 2015-09-27 ENCOUNTER — Ambulatory Visit
Admission: RE | Admit: 2015-09-27 | Discharge: 2015-09-27 | Disposition: A | Discharge status: Home / Self Care | Payer: No Typology Code available for payment source | Source: Ambulatory Visit | Attending: Obstetrics and Gynecology | Admitting: Obstetrics and Gynecology

## 2015-09-27 ENCOUNTER — Encounter (HOSPITAL_COMMUNITY): Payer: Self-pay

## 2015-09-27 VITALS — BP 114/74 | Temp 98.4°F | Ht 65.5 in | Wt 218.2 lb

## 2015-09-27 DIAGNOSIS — N644 Mastodynia: Secondary | ICD-10-CM

## 2015-09-27 DIAGNOSIS — Z1239 Encounter for other screening for malignant neoplasm of breast: Secondary | ICD-10-CM

## 2015-09-27 HISTORY — DX: Bipolar disorder, unspecified: F31.9

## 2015-09-27 HISTORY — DX: Other intervertebral disc displacement, lumbar region: M51.26

## 2015-09-27 HISTORY — DX: Dissociative identity disorder: F44.81

## 2015-09-27 HISTORY — DX: Gastro-esophageal reflux disease without esophagitis: K21.9

## 2015-09-27 HISTORY — DX: Major depressive disorder, single episode, unspecified: F32.9

## 2015-09-27 HISTORY — DX: Depression, unspecified: F32.A

## 2015-09-27 HISTORY — DX: Anxiety disorder, unspecified: F41.9

## 2015-09-27 NOTE — Patient Instructions (Addendum)
Explained breast self awareness to Erika Henson. Patient did not need a Pap smear today due to last Pap smear was 03/18/2013. Let her know [facility] will cover Pap smears every 3 years unless has a history of abnormal Pap smears. Told patient she can have Pap smear completed in [facility] and to contact [facility] for an appointment. Reminder her that her next Pap smear is due in February 2018. Referred patient to the [facility] for diagnostic mammogram. Appointment scheduled for Thursday, September 27, 2015 at 1010. Discussed smoking cessation with patient. Referred patient to the [facility] Quitline and gave resources to free smoking cessation classes offered at [facility]. Erika Henson verbalized understanding.  Erika Henson, Arvil Chaco, RN 10:27 AM

## 2015-09-27 NOTE — Progress Notes (Signed)
Complaints of bilateral nipples swollen x 2 months. Patient states nipples feel itchy inside and are dry. Patient complained of bilateral nipple pain stating she couldn't rate on a scale from 1-10. She said it felt more uncomfortable and irritated feeling.  Pap Smear:  Pap smear not completed today. Last Pap smear was 03/18/2013 at [facility] and normal. Per patient has a history of an abnormal Pap smear in 1998 that she had a colposcopy and cryotherapy completed. Per patient all Pap smears have been normal since cryotherapy was completed. Last Pap smear result is in EPIC.  Physical exam: Breasts Breasts symmetrical. No skin abnormalities bilateral breasts. No nipple retraction bilateral breasts. No nipple discharge bilateral breasts. No lymphadenopathy. No lumps palpated bilateral breasts. Complaints of bilateral nipple tenderness. Referred patient to the [facility] for diagnostic mammogram. Appointment scheduled for Thursday, September 27, 2015 at 1010.        Pelvic/Bimanual No Pap smear completed today since last Pap smear was 03/18/2013. Pap smear not indicated per [facility] guidelines.   Smoking History: Patient is a current smoker. Discussed smoking cessation with patient. Referred patient to the [facility] Quitline and gave resources to free smoking cessation classes offered at [facility].  Patient Navigation: Patient education provided. Access to services provided for patient through [facility] program.

## 2015-10-01 ENCOUNTER — Encounter (HOSPITAL_COMMUNITY): Payer: Self-pay | Admitting: *Deleted

## 2016-03-25 ENCOUNTER — Encounter (HOSPITAL_COMMUNITY): Payer: Self-pay

## 2016-03-25 ENCOUNTER — Ambulatory Visit (HOSPITAL_COMMUNITY)
Admission: RE | Admit: 2016-03-25 | Discharge: 2016-03-25 | Disposition: A | Discharge status: Home / Self Care | Payer: No Typology Code available for payment source | Source: Ambulatory Visit | Attending: Obstetrics and Gynecology | Admitting: Obstetrics and Gynecology

## 2016-03-25 VITALS — BP 124/80 | Temp 98.6°F | Ht 65.5 in | Wt 228.2 lb

## 2016-03-25 DIAGNOSIS — Z01419 Encounter for gynecological examination (general) (routine) without abnormal findings: Secondary | ICD-10-CM

## 2016-03-25 NOTE — Patient Instructions (Signed)
Explained breast self awareness with Benard Rink. Let patient know if today's Pap smear is normal, then her next Pap smear will be due in 3 years. Informed patient that will follow up with her within the next couple weeks with results of Pap smear by phone. Reminded patient that her next mammogram is due in August 2018. Discussed smoking cessation with patient. Referred patient to the [facility] Quitline and gave resources to free smoking cessation classes offered at [place] verbalized understanding.  Tifini Reeder, Arvil Chaco, RN 12:34 PM

## 2016-03-25 NOTE — Progress Notes (Signed)
No complaints today.   Pap Smear: Pap smear completed today. Last Pap smear was 03/18/2013 at [facility] and normal. Per patient has a history of an abnormal Pap smear in 1998 that she had a colposcopy and cryotherapy completed. Per patient all Pap smears have been normal since cryotherapy was completed. Last Pap smear result is in EPIC.  Pelvic/Bimanual   Ext Genitalia No lesions, no swelling and no discharge observed on external genitalia.         Vagina Vagina pink and normal texture. No lesions or discharge observed in vagina.          Cervix Cervix is present. Cervix pink and of normal texture. No discharge observed.     Uterus Uterus is present and palpable. Uterus in normal position and normal size.        Adnexae Bilateral ovaries present and palpable. No tenderness on palpation.          Rectovaginal No rectal exam completed today since patient had no rectal complaints. No skin abnormalities observed on exam.    Smoking History: Patient is a current smoker. Discussed smoking cessation with patient. Referred patient to the [facility] Quitline and gave resources to free smoking cessation classes offered at [facility].  Patient Navigation: Patient education provided. Access to services provided for patient through [facility] program.

## 2016-03-27 LAB — CYTOLOGY - PAP
Diagnosis: UNDETERMINED — AB
HPV: NOT DETECTED

## 2016-04-23 ENCOUNTER — Telehealth (HOSPITAL_COMMUNITY): Payer: Self-pay | Admitting: *Deleted

## 2016-04-23 NOTE — Telephone Encounter (Signed)
Patient called and left voicemail in regards to receiving a bill for her Pap smear. Called patient back and explained bill is covered by [employer]. Asked patient to either fax, mail, or bring a copy of the bill and will take care of it. Patient stated she will bring me a copy tomorrow. Patient complained of painful uterine cramps x 11 days. Patient states she is still having uterine cramping and her period has stopped. Patient is concerned. Offered to refer her to the [facility] [place] [facility] [place] that the appointment will not be covered by [facility] but can send the referral. Told patient she will need to complete financial assistance paperwork. Gave patient the results of her Pap smear. Let her know that her Pap smear showed abnormal cells and the HPV is negative. Told patient that her next Pap smear is due in one year. Informed patient that the [facility] will call her with appointment and if hasn't received a call within a week to give me a call. Patient verbalized understanding.

## 2016-04-28 ENCOUNTER — Encounter: Payer: Self-pay | Admitting: General Practice

## 2016-05-05 ENCOUNTER — Ambulatory Visit: Payer: Self-pay | Attending: Internal Medicine | Admitting: Podiatry

## 2016-05-05 DIAGNOSIS — G629 Polyneuropathy, unspecified: Secondary | ICD-10-CM

## 2016-05-05 DIAGNOSIS — B351 Tinea unguium: Secondary | ICD-10-CM

## 2016-05-05 DIAGNOSIS — L600 Ingrowing nail: Secondary | ICD-10-CM

## 2016-05-05 MED ORDER — CICLOPIROX 8 % EX SOLN: Freq: Every day | CUTANEOUS | 0 refills | Status: AC

## 2016-05-05 NOTE — Patient Instructions (Signed)
Ciclopirox nail solution What is this medicine? CICLOPIROX (sye kloe PEER ox) NAIL SOLUTION is an antifungal medicine. It used to treat fungal infections of the nails. This medicine may be used for other purposes; ask your health care provider or pharmacist if you have questions. COMMON BRAND NAME(S): CNL8, Penlac What should I tell my health care provider before I take this medicine? They need to know if you have any of these conditions: -diabetes mellitus -history of seizures -HIV infection -immune system problems or organ transplant -large areas of burned or damaged skin -peripheral vascular disease or poor circulation -taking corticosteroid medication (including steroid inhalers, cream, or lotion) -an unusual or allergic reaction to ciclopirox, isopropyl alcohol, other medicines, foods, dyes, or preservatives -pregnant or trying to get pregnant -breast-feeding How should I use this medicine? This medicine is for external use only. Follow the directions that come with this medicine exactly. Wash and dry your hands before use. Avoid contact with the eyes, mouth or nose. If you do get this medicine in your eyes, rinse out with plenty of cool tap water. Contact your doctor or health care professional if eye irritation occurs. Use at regular intervals. Do not use your medicine more often than directed. Finish the full course prescribed by your doctor or health care professional even if you think you are better. Do not stop using except on your doctor's advice. Talk to your pediatrician regarding the use of this medicine in children. While this medicine may be prescribed for children as young as 12 years for selected conditions, precautions do apply. Overdosage: If you think you have taken too much of this medicine contact a poison control center or emergency room at once. NOTE: This medicine is only for you. Do not share this medicine with others. What if I miss a dose? If you miss a dose, use  it as soon as you can. If it is almost time for your next dose, use only that dose. Do not use double or extra doses. What may interact with this medicine? Interactions are not expected. Do not use any other skin products without telling your doctor or health care professional. This list may not describe all possible interactions. Give your health care provider a list of all the medicines, herbs, non-prescription drugs, or dietary supplements you use. Also tell them if you smoke, drink alcohol, or use illegal drugs. Some items may interact with your medicine. What should I watch for while using this medicine? Tell your doctor or health care professional if your symptoms get worse. Four to six months of treatment may be needed for the nail(s) to improve. Some people may not achieve a complete cure or clearing of the nails by this time. Tell your doctor or health care professional if you develop sores or blisters that do not heal properly. If your nail infection returns after stopping using this product, contact your doctor or health care professional. What side effects may I notice from receiving this medicine? Side effects that you should report to your doctor or health care professional as soon as possible: -allergic reactions like skin rash, itching or hives, swelling of the face, lips, or tongue -severe irritation, redness, burning, blistering, peeling, swelling, oozing Side effects that usually do not require medical attention (report to your doctor or health care professional if they continue or are bothersome): -mild reddening of the skin -nail discoloration -temporary burning or mild stinging at the site of application This list may not describe all possible side effects.   Call your doctor for medical advice about side effects. You may report side effects to FDA at 1-800-FDA-1088. Where should I keep my medicine? Keep out of the reach of children. Store at room temperature between 15 and 30  degrees C (59 and 86 degrees F). Do not freeze. Protect from light by storing the bottle in the carton after every use. This medicine is flammable. Keep away from heat and flame. Throw away any unused medicine after the expiration date. NOTE: This sheet is a summary. It may not cover all possible information. If you have questions about this medicine, talk to your doctor, pharmacist, or health care provider.  2018 Elsevier/Gold Standard (2007-04-26 16:49:20)  

## 2016-05-07 ENCOUNTER — Encounter: Payer: Self-pay | Admitting: Obstetrics & Gynecology

## 2016-05-08 NOTE — Progress Notes (Signed)
**  SEEN AT [facility]**  Subjective: 41 year old female presents the office today for concerns of a possible ingrown toenail to the right big toe. She states that the nails also white in color. She denies any pain to the toenail she denies any redness or drainage or any swelling. She denies any other signs of infection as well. Denies any systemic complaints such as fevers, chills, nausea, vomiting. No acute changes since last appointment, and no other complaints at this time.   Objective: AAO x3, NAD DP/PT pulses palpable bilaterally, CRT less than 3 seconds Right hallux toenails white to yellow discoloration to the nail. There is incurvation of both the medial lateral nail borders have there is no pain although she does have neuropathy. There is no edema, erythema, drainage or pus or any other clinical signs of infection. No open lesions or pre-ulcerative lesions.  No pain with calf compression, swelling, warmth, erythema  Assessment: Right hallux ingrown toenail, onychomycosis  Plan: -All treatment options discussed with the patient including all alternatives, risks, complications.  -Discussed partial nail avulsion however the areas adjacent metatarsal we will hold off. I prescribed Penlac for nail fungus. Discussed daily foot inspection as well. Monitor for infection.  Encouraged her not to get pedicures or soaking her foot at home.- -Patient encouraged to call the office with any questions, concerns, change in symptoms.   Celesta Gentile, DPM

## 2016-05-13 ENCOUNTER — Telehealth: Payer: Self-pay | Admitting: *Deleted

## 2016-05-13 NOTE — Telephone Encounter (Signed)
Called patient and stated to the patient she could try an over the counter fungi-nail due to the prescription was too much and I stated as well if she needs to call the office with any concerns or questions. Erika Henson

## 2016-05-16 ENCOUNTER — Encounter: Payer: Self-pay | Admitting: Obstetrics & Gynecology

## 2016-05-16 ENCOUNTER — Ambulatory Visit (INDEPENDENT_AMBULATORY_CARE_PROVIDER_SITE_OTHER): Payer: Self-pay | Admitting: Obstetrics & Gynecology

## 2016-05-16 ENCOUNTER — Other Ambulatory Visit: Payer: Self-pay

## 2016-05-16 VITALS — BP 146/107 | HR 97 | Wt 228.0 lb

## 2016-05-16 DIAGNOSIS — Z3202 Encounter for pregnancy test, result negative: Secondary | ICD-10-CM

## 2016-05-16 DIAGNOSIS — Z113 Encounter for screening for infections with a predominantly sexual mode of transmission: Secondary | ICD-10-CM

## 2016-05-16 DIAGNOSIS — R102 Pelvic and perineal pain: Secondary | ICD-10-CM

## 2016-05-16 MED ORDER — IBUPROFEN 800 MG PO TABS: 800.0000 mg | ORAL_TABLET | Freq: Three times a day (TID) | ORAL | 1 refills | Status: AC | PRN

## 2016-05-16 MED ORDER — IBUPROFEN 800 MG PO TABS: 800.0000 mg | ORAL_TABLET | Freq: Three times a day (TID) | ORAL | 1 refills | Status: DC | PRN

## 2016-05-16 NOTE — Progress Notes (Signed)
   Subjective:    Patient ID: Erika Henson, female    DOB: Jun 02, 1975, 41 y.o.   MRN: [id number]  HPI  41 yo woman here with the complaint of new onset bad uterine cramping since her pap smear 2/20. She says that she takes too many other medicines to bother taking IBU (although later in the visit she said that she has tried it).  She also complains of a knot at the bottom of her left labia majora. She and her boyfriend have tried to "pop" it for months".  Review of Systems     Objective:   Physical Exam Morbidly obese WFNAD Abd- benign She has a 1 cm firm nodule beneath the skin at the bottom of her labia majora c/w folliculitis (I have suggested that she quit shaving her vulva) Her bimanual exam is relatively unhelpful due to her body habitus.        Assessment & Plan:  Folliculitis- reassurance given. Rec quit shaving Uterine cramping since her pap smear- I will check cervical cultures and a gyn u/s IBU 800 mg prescribed RTC 4 weeks

## 2016-05-17 LAB — POCT PREGNANCY, URINE: PREG TEST UR: NEGATIVE

## 2016-05-19 LAB — GC/CHLAMYDIA PROBE AMP (~~LOC~~) NOT AT ARMC
Chlamydia: NEGATIVE
Neisseria Gonorrhea: NEGATIVE

## 2016-05-22 ENCOUNTER — Ambulatory Visit (HOSPITAL_COMMUNITY)
Admission: RE | Admit: 2016-05-22 | Discharge: 2016-05-22 | Disposition: A | Discharge status: Home / Self Care | Payer: Self-pay | Source: Ambulatory Visit | Attending: Obstetrics & Gynecology | Admitting: Obstetrics & Gynecology

## 2016-05-22 DIAGNOSIS — R102 Pelvic and perineal pain: Secondary | ICD-10-CM | POA: Insufficient documentation

## 2016-05-22 DIAGNOSIS — D259 Leiomyoma of uterus, unspecified: Secondary | ICD-10-CM | POA: Insufficient documentation

## 2016-05-30 ENCOUNTER — Telehealth: Payer: Self-pay | Admitting: *Deleted

## 2016-05-30 NOTE — Telephone Encounter (Signed)
Patient left message, would like the results of her ultrasound. Please call.

## 2016-06-02 NOTE — Telephone Encounter (Signed)
Called patient and gave her u/s results, essentially normal with a very small fibroid. Reassured patient that this is not cancer and she should not be concerned about it. Patient voiced understanding. Patient stated she will not be able to make her visit with Dr Hulan Fray on 5/11 due to not being able to pay for it. I told her I would have the appointment cancelled but that if she needed to see Dr Hulan Fray don't hesitate to call us.

## 2016-06-13 ENCOUNTER — Ambulatory Visit: Payer: Self-pay | Admitting: Obstetrics & Gynecology

## 2016-09-08 ENCOUNTER — Other Ambulatory Visit: Payer: Self-pay | Admitting: Obstetrics and Gynecology

## 2016-09-08 DIAGNOSIS — Z1231 Encounter for screening mammogram for malignant neoplasm of breast: Secondary | ICD-10-CM

## 2016-11-14 ENCOUNTER — Other Ambulatory Visit: Payer: Self-pay | Admitting: Nurse Practitioner

## 2016-11-14 DIAGNOSIS — Z1231 Encounter for screening mammogram for malignant neoplasm of breast: Secondary | ICD-10-CM

## 2017-05-25 ENCOUNTER — Emergency Department (HOSPITAL_COMMUNITY): Payer: No Typology Code available for payment source

## 2017-05-25 ENCOUNTER — Emergency Department (HOSPITAL_COMMUNITY)
Admission: EM | Admit: 2017-05-25 | Discharge: 2017-05-25 | Disposition: A | Discharge status: Home / Self Care | Payer: No Typology Code available for payment source | Attending: Emergency Medicine | Admitting: Emergency Medicine

## 2017-05-25 ENCOUNTER — Encounter (HOSPITAL_COMMUNITY): Payer: Self-pay

## 2017-05-25 DIAGNOSIS — Y939 Activity, unspecified: Secondary | ICD-10-CM | POA: Insufficient documentation

## 2017-05-25 DIAGNOSIS — J45909 Unspecified asthma, uncomplicated: Secondary | ICD-10-CM | POA: Diagnosis not present

## 2017-05-25 DIAGNOSIS — Y990 Civilian activity done for income or pay: Secondary | ICD-10-CM | POA: Insufficient documentation

## 2017-05-25 DIAGNOSIS — W010XXA Fall on same level from slipping, tripping and stumbling without subsequent striking against object, initial encounter: Secondary | ICD-10-CM | POA: Insufficient documentation

## 2017-05-25 DIAGNOSIS — S8392XA Sprain of unspecified site of left knee, initial encounter: Secondary | ICD-10-CM | POA: Diagnosis not present

## 2017-05-25 DIAGNOSIS — Z79899 Other long term (current) drug therapy: Secondary | ICD-10-CM | POA: Diagnosis not present

## 2017-05-25 DIAGNOSIS — F17210 Nicotine dependence, cigarettes, uncomplicated: Secondary | ICD-10-CM | POA: Insufficient documentation

## 2017-05-25 DIAGNOSIS — E119 Type 2 diabetes mellitus without complications: Secondary | ICD-10-CM | POA: Diagnosis not present

## 2017-05-25 DIAGNOSIS — S8992XA Unspecified injury of left lower leg, initial encounter: Secondary | ICD-10-CM | POA: Diagnosis present

## 2017-05-25 DIAGNOSIS — S5002XA Contusion of left elbow, initial encounter: Secondary | ICD-10-CM | POA: Diagnosis not present

## 2017-05-25 DIAGNOSIS — Y929 Unspecified place or not applicable: Secondary | ICD-10-CM | POA: Insufficient documentation

## 2017-05-25 MED ORDER — ACETAMINOPHEN 500 MG PO TABS
1000.0000 mg | ORAL_TABLET | Freq: Once | ORAL | Status: AC
  Administered 2017-05-25: 1000 mg via ORAL
  Filled 2017-05-25: qty 2

## 2017-05-25 NOTE — Discharge Instructions (Signed)
You can take Tylenol or Ibuprofen as directed for pain. You can alternate Tylenol and Ibuprofen every 4 hours. If you take Tylenol at 1pm, then you can take Ibuprofen at 5pm. Then you can take Tylenol again at 9pm.   Follow the RICE (Rest, Ice, Compression, Elevation) protocol as directed.   Wear the brace for comfort and stabilization.  You can use the crutches for the first few days to keep weight off of it.  Once pain is improved, gently increase weightbearing activity as tolerated once you are no longer reliant on the crutches.  Return to emergency department for any worsening pain, worsening swelling, numbness/weakness of the leg, fever or any other worsening or concerning symptoms.

## 2017-05-25 NOTE — ED Triage Notes (Signed)
Pt reports she tripped over plastic and fell at work this morning and injured left elbow and knee.

## 2017-05-25 NOTE — ED Provider Notes (Signed)
[facility]ENC[facility] DEPARTMENT Provider Note   CSN: [id number] Arrival date & time: 05/25/17  0741     History   Chief Complaint Chief Complaint  Patient presents with  . Knee Pain  . Arm Pain    HPI Erika Henson is a 42 y.o. female alert, depression, diabetes who presents for evaluation after mechanical fall that occurred this morning approximately 6 AM.  Patient reports she was at work when she excellently tripped over a plastic pain, causing her to fall forward and landing on her left knee and left elbow.  Patient states she did not hit her head and did not lose consciousness.  She remembers the entire event.  Patient reports that she was able to get up and went back to sleep after the incident.  She reports she has not been able to put her full weight on the knee since the incident.  Patient reports she has not taken the medication since the incident.  Patient denies any nausea/vomiting, numbness/weakness.  The history is provided by the patient.    Past Medical History:  Diagnosis Date  . Acid reflux   . Anxiety   . Asthma   . Bipolar 1 disorder ([place])   . Depression   . Diabetes mellitus without complication ([place])   . 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 Ruptured lumbar disc     Patient Active Problem List   Diagnosis Date Noted  . Gonorrhea in female 05/20/2013  . Genital herpes 05/20/2013  . Dysmenorrhea 04/13/2013  . Chlamydia infection 03/25/2013  . Trichomoniasis of vagina 03/25/2013    Past Surgical History:  Procedure Laterality Date  . TUBAL LIGATION       OB History    Gravida  3   Para  3   Term  2   Preterm  1   AB      Living  2     SAB      TAB      Ectopic      Multiple      Live Births  3            Home Medications    Prior to Admission medications   Medication Sig Start Date End Date Taking? Authorizing Provider  albuterol (PROVENTIL HFA;VENTOLIN HFA) 108 (90 BASE) MCG/ACT inhaler Inhale 1 puff  into the lungs every 6 (six) hours as needed for wheezing or shortness of breath.    [provider]  atorvastatin (LIPITOR) 10 MG tablet Take 10 mg by mouth daily.    [provider]  azithromycin (ZITHROMAX) 250 MG tablet Take 4 tablets (1,000 mg total) by mouth once. Patient not taking: Reported on 06/05/2014 03/10/14   Shelly Bombard, MD  cefTRIAXone (ROCEPHIN) 250 MG injection Inject 250 mg into the muscle once.  FOR IM use in LARGE MUSCLE MASS Patient not taking: Reported on 06/05/2014 03/10/14   Shelly Bombard, MD  ciclopirox [facility]) 8 % solution Apply topically at bedtime. Apply over nail and surrounding skin. Apply daily over previous coat. After seven (7) days, may remove with alcohol and continue cycle. Patient not taking: Reported on 05/16/2016 05/05/16   Trula Slade, DPM  citalopram (CELEXA) 20 MG tablet Take 20 mg by mouth daily.    [provider]  cloNIDine (CATAPRES) 0.1 MG tablet Take 0.1 mg by mouth 2 (two) times daily.    [provider]  clotrimazole (LOTRIMIN) 1 % cream APPLY 1 APPLICATION TOPICALLY TWO   (  TWO) TIMES DAILY. 03/02/15   Shelly Bombard, MD  DULoxetine (CYMBALTA) 30 MG capsule Take 30 mg by mouth daily.    [provider]  fluconazole (DIFLUCAN) 200 MG tablet Take 1 tablet (200 mg total) by mouth every other day. 03/06/14   Shelly Bombard, MD  gabapentin (NEURONTIN) 100 MG capsule Take 300 mg by mouth 2 (two) times daily.     [provider]  ibuprofen (ADVIL,MOTRIN) 800 MG tablet Take 1 tablet (800 mg total) by mouth every 8 (eight) hours as needed. 05/16/16   Emily Filbert, MD  insulin glargine (LANTUS) 100 UNIT/ML injection Inject 25 Units into the skin at bedtime.    [provider]  lamoTRIgine (LAMICTAL) 100 MG tablet Take 100 mg by mouth daily.    [provider]  lurasidone (LATUDA) 80 MG TABS tablet Take 80 mg by mouth daily with breakfast.    [provider]    metroNIDAZOLE (FLAGYL) 500 MG tablet Take 1 tablet (500 mg total) by mouth 2 (two) times daily. Patient not taking: Reported on 06/05/2014 03/10/14   Shelly Bombard, MD  omeprazole (PRILOSEC) 40 MG capsule Take 40 mg by mouth daily.    [provider]  sitaGLIPtin-metformin (JANUMET) 50-1000 MG tablet Take 1 tablet by mouth 2 (two) times daily with a meal.    [provider]  valACYclovir (VALTREX) 1000 MG tablet Take 1 tablet (1,000 mg total) by mouth daily. For suppression. 03/06/14   Shelly Bombard, MD  venlafaxine (EFFEXOR) 37.5 MG tablet Take 37.5 mg by mouth every morning.    [provider]    Family History Family History  Problem Relation Age of Onset  . Breast cancer Maternal Grandmother     Social History Social History   Tobacco Use  . Smoking status: Current Some Day Smoker    Packs/day: 0.50    Years: 30.00    Pack years: 15.00    Types: Cigarettes  . Smokeless tobacco: Former Network engineer Use Topics  . Alcohol use: Yes    Comment: occasional  . Drug use: No    Types: Marijuana     Allergies   Latex   Review of Systems Review of Systems  Gastrointestinal: Negative for vomiting.  Musculoskeletal:       Elbow pain Knee pain  Neurological: Negative for weakness and numbness.     Physical Exam Updated Vital Signs BP 137/81 (BP Location: Right Arm)   Pulse 83   Temp 97.8 F (36.6 C) (Oral)   Resp 18   Ht 5\' 5"  (1.651 m)   Wt 98.4 kg (217 lb)   LMP 05/25/2017   SpO2 100%   BMI 36.11 kg/m   Physical Exam  Constitutional: She appears well-developed and well-nourished.  HENT:  Head: Normocephalic and atraumatic.  Eyes: Conjunctivae and EOM are normal. Right eye exhibits no discharge. Left eye exhibits no discharge. No scleral icterus.  Pulmonary/Chest: Effort normal.  Musculoskeletal:  Tenderness palpation noted to the anterior aspect of the left knee.  There is some mild soft tissue swelling and ecchymosis noted  to the anterior aspect.  No deformity or crepitus noted.  Extension completed without difficulty.  Patient does have some pain with flexion.  Negative anterior posterior drawer test.  Mild instability on valgus stress. No instability noted on varus.  No tenderness palpation to proximal tib-fib, distal tib-fib, ankle.  No abnormalities of the right lower extremity.  Tenderness palpation overlying the posterior left  elbow.  Overlying abrasion.  No soft tissue swelling, no deformity, crepitus.  Full flexion/extension of elbow intact without any difficulty.  Full range of motion of left shoulder intact without any difficulty.  No tenderness palpation left shoulder, left forearm, left wrist.  Neurological: She is alert.  Sensation intact along major nerve distributions of BUE and BLE  Skin: Skin is warm and dry. Capillary refill takes less than 2 seconds.  Good distal cap refill.   Psychiatric: She has a normal mood and affect. Her speech is normal and behavior is normal.  Nursing note and vitals reviewed.    ED Treatments / Results  Labs (all labs ordered are listed, but only abnormal results are displayed) Labs Reviewed - No data to display  EKG None  Radiology Dg Elbow Complete Left  Result Date: 05/25/2017 CLINICAL DATA:  42 year old female status post fall with left knee and elbow pain EXAM: LEFT ELBOW - COMPLETE 3+ VIEW COMPARISON:  Concurrently obtained radiographs of the left knee FINDINGS: There is no evidence of fracture, dislocation, or joint effusion. There is no evidence of arthropathy or other focal bone abnormality. Soft tissues are unremarkable. IMPRESSION: Negative. Electronically Signed   By: Jacqulynn Cadet M.D.   On: 05/25/2017 08:22   Dg Knee 2 Views Left  Result Date: 05/25/2017 CLINICAL DATA:  42 year old female with left knee pain after falling EXAM: LEFT KNEE - 1-2 VIEW COMPARISON:  None. FINDINGS: No evidence of fracture, dislocation, or joint effusion. No evidence of  arthropathy or other focal bone abnormality. Soft tissues are unremarkable. IMPRESSION: Negative. Electronically Signed   By: Jacqulynn Cadet M.D.   On: 05/25/2017 08:22    Procedures Procedures (including critical care time)  Medications Ordered in ED Medications  acetaminophen (TYLENOL) tablet 1,000 mg (1,000 mg Oral Given 05/25/17 0951)     Initial Impression / Assessment and Plan / ED Course  I have reviewed the triage vital signs and the nursing notes.  Pertinent labs & imaging results that were available during my care of the patient were reviewed by me and considered in my medical decision making (see chart for details).     42 year old female who presents for evaluation after mechanical fall that occurred this morning at work.  Complaining of left elbow and left knee pain.  Reports she did not hit her head, lose consciousness.  Patient reports she was able to limp on the left knee but not able to put full weight on it.  Patient denies any numbness/weakness. Patient is afebrile, non-toxic appearing, sitting comfortably on examination table. Vital signs reviewed and stable.  On exam, patient tenderness palpation overlying the anterior aspect of the left knee.  There is some mild overlying soft tissue swelling and ecchymosis.  No deformity or crepitus noted.  Slight instability noted on valgus stress.  Consider sprain versus strain versus dislocation versus fracture.  Palpation noted to the elbow.  No deformity or crepitus noted.  Small abrasion overlying.  Consider sprain versus strain versus contusion versus fracture versus dislocation.  X-rays ordered at triage.  Analgesics provided in the ED.  X-rays reviewed.  X-rays negative for any fracture or bony dislocation.  There is no evidence of effusion, fat pad sign would indicate occult fracture.  No further imaging indicated.  X-ray of the negative for any acute fracture dislocation.  Discussed results with patient.  Given patient's  inability to bear full weight and slight instability noted on valgus stress, will treat as knee sprain.  Plan for  knee immobilizer and crutches.  Patient instructed on supportive at home therapies. Patient had ample opportunity for questions and discussion. All patient's questions were answered with full understanding.  Strict return precautions discussed. Patient expresses understanding and agreement to plan.   Final Clinical Impressions(s) / ED Diagnoses   Final diagnoses:  Sprain of left knee, unspecified ligament, initial encounter  Contusion of left elbow, initial encounter    ED Discharge Orders    None       Desma Mcgregor 05/25/17 7096    Milton Ferguson, MD 05/26/17 [id number]
